# Patient Record
Sex: Female | Born: 1985 | Race: White | Hispanic: No | Marital: Married | Smoking: Never smoker
Health system: Southern US, Community
[De-identification: ages and names within clinical notes are randomized; demographics above are authoritative.]

## PROBLEM LIST (undated history)

## (undated) DIAGNOSIS — D649 Anemia, unspecified: Secondary | ICD-10-CM

## (undated) DIAGNOSIS — F329 Major depressive disorder, single episode, unspecified: Secondary | ICD-10-CM

## (undated) DIAGNOSIS — S6291XA Unspecified fracture of right wrist and hand, initial encounter for closed fracture: Secondary | ICD-10-CM

## (undated) DIAGNOSIS — M8430XA Stress fracture, unspecified site, initial encounter for fracture: Secondary | ICD-10-CM

## (undated) DIAGNOSIS — B019 Varicella without complication: Secondary | ICD-10-CM

## (undated) DIAGNOSIS — F32A Depression, unspecified: Secondary | ICD-10-CM

## (undated) DIAGNOSIS — T7840XA Allergy, unspecified, initial encounter: Secondary | ICD-10-CM

## (undated) HISTORY — DX: Anemia, unspecified: D64.9

## (undated) HISTORY — DX: Unspecified fracture of right wrist and hand, initial encounter for closed fracture: S62.91XA

## (undated) HISTORY — DX: Major depressive disorder, single episode, unspecified: F32.9

## (undated) HISTORY — DX: Depression, unspecified: F32.A

## (undated) HISTORY — DX: Allergy, unspecified, initial encounter: T78.40XA

## (undated) HISTORY — DX: Varicella without complication: B01.9

## (undated) HISTORY — DX: Stress fracture, unspecified site, initial encounter for fracture: M84.30XA

---
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
## 2010-07-29 DIAGNOSIS — S6291XA Unspecified fracture of right wrist and hand, initial encounter for closed fracture: Secondary | ICD-10-CM

## 2010-07-29 HISTORY — PX: OTHER SURGICAL HISTORY: SHX169

## 2010-07-29 HISTORY — DX: Unspecified fracture of right wrist and hand, initial encounter for closed fracture: S62.91XA

## 2013-03-29 DIAGNOSIS — M8430XA Stress fracture, unspecified site, initial encounter for fracture: Secondary | ICD-10-CM

## 2013-03-29 HISTORY — DX: Stress fracture, unspecified site, initial encounter for fracture: M84.30XA

## 2014-07-29 HISTORY — PX: ESOPHAGOGASTRODUODENOSCOPY: SHX1529

## 2016-10-08 ENCOUNTER — Other Ambulatory Visit (HOSPITAL_COMMUNITY)
Admission: RE | Admit: 2016-10-08 | Discharge: 2016-10-08 | Disposition: A | Discharge status: Home / Self Care | Payer: 59 | Source: Ambulatory Visit | Attending: Nurse Practitioner | Admitting: Nurse Practitioner

## 2016-10-08 ENCOUNTER — Other Ambulatory Visit: Payer: Self-pay | Admitting: Nurse Practitioner

## 2016-10-08 DIAGNOSIS — Z01419 Encounter for gynecological examination (general) (routine) without abnormal findings: Secondary | ICD-10-CM | POA: Diagnosis present

## 2016-10-08 DIAGNOSIS — Z1151 Encounter for screening for human papillomavirus (HPV): Secondary | ICD-10-CM | POA: Insufficient documentation

## 2016-10-08 DIAGNOSIS — Z113 Encounter for screening for infections with a predominantly sexual mode of transmission: Secondary | ICD-10-CM | POA: Insufficient documentation

## 2016-10-10 LAB — CYTOLOGY - PAP
Chlamydia: NEGATIVE
Diagnosis: NEGATIVE
HPV (WINDOPATH): NOT DETECTED
NEISSERIA GONORRHEA: NEGATIVE

## 2016-10-14 ENCOUNTER — Ambulatory Visit (INDEPENDENT_AMBULATORY_CARE_PROVIDER_SITE_OTHER): Payer: 59 | Admitting: Family Medicine

## 2016-10-14 ENCOUNTER — Telehealth: Payer: Self-pay | Admitting: Family Medicine

## 2016-10-14 ENCOUNTER — Encounter: Payer: Self-pay | Admitting: Family Medicine

## 2016-10-14 VITALS — BP 127/82 | HR 62 | Temp 98.2°F | Resp 20 | Ht 64.5 in | Wt 152.2 lb

## 2016-10-14 DIAGNOSIS — Z7689 Persons encountering health services in other specified circumstances: Secondary | ICD-10-CM | POA: Diagnosis not present

## 2016-10-14 DIAGNOSIS — R42 Dizziness and giddiness: Secondary | ICD-10-CM

## 2016-10-14 DIAGNOSIS — M67472 Ganglion, left ankle and foot: Secondary | ICD-10-CM | POA: Diagnosis not present

## 2016-10-14 LAB — POCT GLYCOSYLATED HEMOGLOBIN (HGB A1C): Hemoglobin A1C: 4.8

## 2016-10-14 NOTE — Telephone Encounter (Signed)
Patient notified and verbalized understanding. 

## 2016-10-14 NOTE — Progress Notes (Signed)
Patient ID: Vicki Young, female  DOB: [birth year], 31 y.o.   MRN: [id number] Patient Care Team    Relationship Specialty Notifications Start End  Ma Hillock, DO PCP - General Family Medicine  10/14/16     Subjective:  Vicki Young is a 31 y.o.  female present for new patient establishment. All past medical history, surgical history, allergies, family history, immunizations, medications and social history were obtained and updated in the electronic medical record today. All recent labs, ED visits and hospitalizations within the last year were reviewed.  Left ankle "lump":  - started 09/21/2016. Noticed because she felt it. She states it was harder, but not softer. She is a runner and sometimes her shoes cause her pain over this area. ~20 miles a week in running. She states she has a wide foot. She also thinks the other foot "might" be get one. She denies injury to area.   Dizziness:   Once a week to once every 2 weeks she will experience "dizziness and shakiness" that is resolved with eating something. She states if she has more than 1 cup of coffee in the morning, and dose not eat "enough" then she will have symptoms. She has cut back on her coffee consumption for this reason. She also has had symptoms when running, and had to stop to get food. She has not checked her sugar at these times, but feels it is always resolved by eating. She states it can happen sometimes even if she just ate 2 hours before. She denies any syncopal episodes, chest pain, shortness of breath, polydipsia.   Health maintenance:  Colonoscopy: No fhx, screen routine.  Mammogram: FHx no. Screen routine.  Cervical cancer screening: last pap: 2008, [facility]? Vicki Cooler, Vicki Young Immunizations: tdap 2007, Influenza 2017 (encouraged yearly) Infectious disease screening: HIV unknown screen.  DEXA: N/A   Depression screen PHQ 2/9 10/14/2016  Decreased Interest 0  Down, Depressed, Hopeless 0  PHQ - 2 Score  0   Current Exercise Habits: Home exercise routine, Type of exercise: Other - see comments (running), Time (Minutes): 45, Frequency (Times/Week): 6, Weekly Exercise (Minutes/Week): 270, Intensity: Moderate    Immunization History  Administered Date(s) Administered  . Influenza-Unspecified 04/28/2016  . Tdap 07/29/2005     Past Medical History:  Diagnosis Date  . Allergy   . Anemia   . Chicken pox   . Depression   . Right hand fracture 2012   Allergies  Allergen Reactions  . Other Anaphylaxis    Vicki Young, Vicki Young   Past Surgical History:  Procedure Laterality Date  . endometrial cyst  2003  . ESOPHAGOGASTRODUODENOSCOPY  2016   "removal of meat stuck in her throat" with EGD  . right hand fracture  2012  . TONSILLECTOMY AND ADENOIDECTOMY  2003  . WISDOM TOOTH EXTRACTION  2004   Family History  Problem Relation Age of Onset  . Ovarian cancer Mother   . Depression Mother   . Alcohol abuse Father   . Lung cancer Father   . Stomach cancer Father   . Depression Sister   . Polycystic ovary syndrome Sister   . Endometriosis Sister   . Kidney cancer Maternal Uncle   . Muscular dystrophy Maternal Uncle   . Kidney cancer Maternal Grandmother   . Multiple myeloma Maternal Grandfather   . Stroke Maternal Grandfather    Social History   Social History  . Marital status: Single    Spouse name: N/A  .  Number of children: 0  . Years of education: 63   Occupational History  . sales    Social History Main Topics  . Smoking status: Never Smoker  . Smokeless tobacco: Never Used  . Alcohol use 3.0 oz/week    5 Cans of beer per week  . Drug use: No  . Sexual activity: Yes    Partners: Male    Birth control/ protection: IUD   Other Topics Concern  . Not on file   Social History Narrative   Single. BS degree. In sales.   Drinks caffeine.    Wears seatbelt, Smoke detector in the home.    Firearms in the home.    Exercises routinely.    Feels safe in relationships.        Allergies as of 10/14/2016      Reactions   Other Anaphylaxis   Vicki Young, Vicki Young      Medication List       Accurate as of 10/14/16 12:15 PM. Always use your most recent med list.          fluticasone 50 MCG/ACT nasal spray Commonly known as:  FLONASE Place 2 sprays into both nostrils daily.        Recent Results (from the past 2160 hour(s))  Cytology - PAP     Status: None   Collection Time: 10/08/16 12:00 AM  Result Value Ref Range   Adequacy      Satisfactory for evaluation  endocervical/transformation zone component PRESENT.   Diagnosis      NEGATIVE FOR INTRAEPITHELIAL LESIONS OR MALIGNANCY.   Diagnosis      A LETTER WAS SENT TO THE PATIENT INFORMING HER OF THE ABOVE RESULTS.   HPV NOT DETECTED     Comment: Normal Reference Range - NOT Detected   Chlamydia Negative     Comment: Normal Reference Range - Negative   Neisseria gonorrhea Negative     Comment: Normal Reference Range - Negative   Material Submitted CervicoVaginal Pap [ThinPrep Imaged]    CYTOLOGY - PAP PAP RESULT   POCT glycosylated hemoglobin (Hb A1C)     Status: None   Collection Time: 10/14/16 11:00 AM  Result Value Ref Range   Hemoglobin A1C 4.8     No results found.   ROS: 14 pt review of systems performed and negative (unless mentioned in an HPI)  Objective: BP 127/82 (BP Location: Right Arm, Patient Position: Sitting, Cuff Size: Normal)   Pulse 62   Temp 98.2 F (36.8 C)   Resp 20   Ht 5' 4.5" (1.638 m)   Wt 152 lb 4 oz (69.1 kg)   LMP 10/10/2016   SpO2 100%   BMI 25.73 kg/m  Gen: Afebrile. No acute distress. Nontoxic in appearance, well-developed, well-nourished,  Caucasian female.  HENT: AT. [place].MMM Eyes:Pupils Equal Round Reactive to light, Extraocular movements intact,  Conjunctiva without redness, discharge or icterus. Neck/lymp/endocrine: Supple,no lymphadenopathy, no thyromegaly CV: RRR  Chest: CTAB, no wheeze, rhonchi or crackles.   Abd: Soft. NTND. BS present.    Skin:Warm and well-perfused. Skin intact. Neuro/Msk: Normal gait.Alert. Oriented x3. ~ 2 cm mobile round mass left navicular area. No TTP. No erythema. NV intact distally.  Psych: Normal affect, dress and demeanor. Normal speech. Normal thought content and judgment. Results for orders placed or performed in visit on 10/14/16 (from the past 24 hour(s))  POCT glycosylated hemoglobin (Hb A1C)     Status: None   Collection Time: 10/14/16 11:00 AM  Result Value  Ref Range   Hemoglobin A1C 4.8      Assessment/plan: Vicki Young is a 31 y.o. female present for establishment of care with acute complaint.  Dizziness - Discussed could be episodes of hypoglycemia, but difficult to be certain. Will r/o diabetes.  - Pt to increase the protein content in her diet, especially in the morning before runs.  - If after making dietary changes she continues to have symptoms, would want to refer to endocrine for further eval. Pt aware.  - POCT glycosylated hemoglobin (Hb A1C): 4.8  Ganglion cyst of left foot - this is most consistent with ganglion cyst. Discussed options with her today. I would recommend Sports med referral first, they can [place] in the office and possible drain if it appears to be small cyst. Could also consider ortho if she desired.   Return in about 3 months (around 01/14/2017) for CPE.  Electronically signed by: Howard Pouch, DO [place]

## 2016-10-14 NOTE — Patient Instructions (Signed)
Higher protein content meals. I will call you call you with lab result once I get results.   With foot, make certain shoes are not too old. If area doe snot go away, we would send you to Sports medicine and they can US and give recommendations.   It was a pleasure to meet you.    Please help us help you:  We are honored you have chosen [facility] your [facility] home. Below you will find basic instructions that you may need to access in the future. Please help us help you by reading the instructions, which cover many of the frequent questions we experience.   Prescription refills and request:  -In order to allow more efficient response time, please call your pharmacy for all refills. They will forward the request electronically to us. This allows for the quickest possible response. Request left on a nurse line can take longer to refill, since these are checked as time allows between office patients and other phone calls.  - refill request can take up to 3-5 working days to complete.  - If request is sent electronically and request is appropiate, it is usually completed in 1-2 business days.  - all patients will need to be seen routinely for all chronic medical conditions requiring prescription medications (see follow-up below). If you are overdue for follow up on your condition, you will be asked to make an appointment and we will call in enough medication to cover you until your appointment (up to 30 days).  - all controlled substances will require a face to face visit to request/refill.  - if you desire your prescriptions to go through a new pharmacy, and have an active script at original pharmacy, you will need to call your pharmacy and have scripts transferred to new pharmacy. This is completed between the pharmacy locations and not by your provider.    Results: If any images or labs were ordered, it can take up to 1 week to get results depending on the test ordered and the  lab/facility running and resulting the test. - Normal or stable results, which do not need further discussion, will be released to your mychart immediately with attached note to you. A call will not be generated for normal results. Please make certain to sign up for mychart. If you have questions on how to activate your mychart you can call the front office.  - If your results need further discussion, our office will attempt to contact you via phone, and if unable to reach you after 2 attempts, we will release your abnormal result to your mychart with instructions.  - All results will be automatically released in mychart after 1 week.  - Your provider will provide you with explanation and instruction on all relevant material in your results. Please keep in mind, results and labs may appear confusing or abnormal to the untrained eye, but it does not mean they are actually abnormal for you personally. If you have any questions about your results that are not covered, or you desire more detailed explanation than what was provided, you should make an appointment with your provider to do so.   Our office handles many outgoing and incoming calls daily. If we have not contacted you within 1 week about your results, please check your mychart to see if there is a message first and if not, then contact our office.  In helping with this matter, you help decrease call volume, and therefore allow  [place] to be able to respond to patients needs more efficiently.   Acute office visits (sick visit):  An acute visit is intended for a new problem and are scheduled in shorter time slots to allow schedule openings for patients with new problems. This is the appropriate visit to discuss a new problem. In order to provide you with excellent quality medical care with proper time for you to explain your problem, have an exam and receive treatment with instructions, these appointments should be limited to one new problem per visit. If  you experience a new problem, in which you desire to be addressed, please make an acute office visit, we save openings on the schedule to accommodate you. Please do not save your new problem for any other type of visit, let us take care of it properly and quickly for you.   Follow up visits:  Depending on your condition(s) your provider will need to see you routinely in order to provide you with quality care and prescribe medication(s). Most chronic conditions (Example: hypertension, Diabetes, depression/anxiety... etc), require visits a couple times a year. Your provider will instruct you on proper follow up for your personal medical conditions and history. Please make certain to make follow up appointments for your condition as instructed. Failing to do so could result in lapse in your medication treatment/refills. If you request a refill, and are overdue to be seen on a condition, we will always provide you with a 30 day script (once) to allow you time to schedule.    Medicare wellness (well visit): - we have a wonderful Nurse Maudie Mercury), that will meet with you and provide you will yearly medicare wellness visits. These visits should occur yearly (can not be scheduled less than 1 calendar year apart) and cover preventive health, immunizations, advance directives and screenings you are entitled to yearly through your medicare benefits. Do not miss out on your entitled benefits, this is when medicare will pay for these benefits to be ordered for you.  These are strongly encouraged by your provider and is the appropriate type of visit to make certain you are up to date with all preventive health benefits. If you have not had your medicare wellness exam in the last 12 months, please make certain to schedule one by calling the office and schedule your medicare wellness with Maudie Mercury as soon as possible.   Yearly physical (well visit):  - Adults are recommended to be seen yearly for physicals. Check with your  insurance and date of your last physical, most insurances require one calendar year between physicals. Physicals include all preventive health topics, screenings, medical exam and labs that are appropriate for gender/age and history. You may have fasting labs needed at this visit. This is a well visit (not a sick visit), acute topics should not be covered during this visit.  - Pediatric patients are seen more frequently when they are younger. Your provider will advise you on well child visit timing that is appropriate for your their age. - This is not a medicare wellness visit. Medicare wellness exams do not have an exam portion to the visit. Some medicare companies allow for a physical, some do not allow a yearly physical. If your medicare allows a yearly physical you can schedule the medicare wellness with our nurse Maudie Mercury and have your physical with your provider after, on the same day. Please check with insurance for your full benefits.   Late Policy/No Shows:  - all new patients should arrive 15-30  minutes earlier than appointment to allow [place] time  to  obtain all personal demographics,  insurance information and for you to complete office paperwork. - All established patients should arrive 10-15 minutes earlier than appointment time to update all information and be checked in .  - In our best efforts to run on time, if you are late for your appointment you will be asked to either reschedule or if able, we will work you back into the schedule. There will be a wait time to work you back in the schedule,  depending on availability.  - If you are unable to make it to your appointment as scheduled, please call 24 hours ahead of time to allow [place] to fill the time slot with someone else who needs to be seen. If you do not cancel your appointment ahead of time, you may be charged a no show fee.

## 2016-10-14 NOTE — Telephone Encounter (Signed)
Please call pt: - her a1c/diabetes screen was normal at 4.8.

## 2016-10-16 ENCOUNTER — Encounter: Payer: Self-pay | Admitting: Family Medicine

## 2017-04-10 ENCOUNTER — Ambulatory Visit (INDEPENDENT_AMBULATORY_CARE_PROVIDER_SITE_OTHER): Payer: 59 | Admitting: Family Medicine

## 2017-04-10 ENCOUNTER — Encounter: Payer: Self-pay | Admitting: Family Medicine

## 2017-04-10 VITALS — BP 135/82 | HR 64 | Temp 98.1°F | Resp 20 | Ht 64.0 in | Wt 148.5 lb

## 2017-04-10 DIAGNOSIS — R42 Dizziness and giddiness: Secondary | ICD-10-CM

## 2017-04-10 DIAGNOSIS — Z0000 Encounter for general adult medical examination without abnormal findings: Secondary | ICD-10-CM

## 2017-04-10 DIAGNOSIS — E663 Overweight: Secondary | ICD-10-CM

## 2017-04-10 DIAGNOSIS — Z131 Encounter for screening for diabetes mellitus: Secondary | ICD-10-CM

## 2017-04-10 DIAGNOSIS — Z79899 Other long term (current) drug therapy: Secondary | ICD-10-CM

## 2017-04-10 DIAGNOSIS — Z13220 Encounter for screening for lipoid disorders: Secondary | ICD-10-CM | POA: Diagnosis not present

## 2017-04-10 DIAGNOSIS — Z130 Encounter for screening for diseases of the blood and blood-forming organs and certain disorders involving the immune mechanism: Secondary | ICD-10-CM

## 2017-04-10 LAB — CBC WITH DIFFERENTIAL/PLATELET
BASOS ABS: 0.1 10*3/uL (ref 0.0–0.1)
Basophils Relative: 1.2 % (ref 0.0–3.0)
Eosinophils Absolute: 0.7 10*3/uL (ref 0.0–0.7)
Eosinophils Relative: 9.7 % — ABNORMAL HIGH (ref 0.0–5.0)
HEMATOCRIT: 40.9 % (ref 36.0–46.0)
Hemoglobin: 13.5 g/dL (ref 12.0–15.0)
LYMPHS PCT: 38.5 % (ref 12.0–46.0)
Lymphs Abs: 2.8 10*3/uL (ref 0.7–4.0)
MCHC: 33.1 g/dL (ref 30.0–36.0)
MCV: 91.5 fl (ref 78.0–100.0)
MONOS PCT: 7.1 % (ref 3.0–12.0)
Monocytes Absolute: 0.5 10*3/uL (ref 0.1–1.0)
NEUTROS PCT: 43.5 % (ref 43.0–77.0)
Neutro Abs: 3.1 10*3/uL (ref 1.4–7.7)
Platelets: 359 10*3/uL (ref 150.0–400.0)
RBC: 4.47 Mil/uL (ref 3.87–5.11)
RDW: 13.8 % (ref 11.5–15.5)
WBC: 7.2 10*3/uL (ref 4.0–10.5)

## 2017-04-10 LAB — POCT URINALYSIS DIPSTICK
BILIRUBIN UA: NEGATIVE
GLUCOSE UA: NEGATIVE
Ketones, UA: NEGATIVE
LEUKOCYTES UA: NEGATIVE
NITRITE UA: NEGATIVE
Protein, UA: NEGATIVE
Spec Grav, UA: 1.01 (ref 1.010–1.025)
Urobilinogen, UA: 0.2 E.U./dL
pH, UA: 7 (ref 5.0–8.0)

## 2017-04-10 LAB — COMPREHENSIVE METABOLIC PANEL
ALT: 12 U/L (ref 0–35)
AST: 16 U/L (ref 0–37)
Albumin: 4.6 g/dL (ref 3.5–5.2)
Alkaline Phosphatase: 59 U/L (ref 39–117)
BILIRUBIN TOTAL: 0.5 mg/dL (ref 0.2–1.2)
BUN: 12 mg/dL (ref 6–23)
CO2: 28 meq/L (ref 19–32)
Calcium: 10 mg/dL (ref 8.4–10.5)
Chloride: 104 mEq/L (ref 96–112)
Creatinine, Ser: 0.84 mg/dL (ref 0.40–1.20)
GFR: 83.27 mL/min (ref >60.00)
GLUCOSE: 82 mg/dL (ref 70–99)
POTASSIUM: 4.3 meq/L (ref 3.5–5.1)
Sodium: 138 mEq/L (ref 135–145)
Total Protein: 7.3 g/dL (ref 6.0–8.3)

## 2017-04-10 NOTE — Progress Notes (Deleted)
Patient ID: Vicki Young, female  DOB: 08-Nov-1985, 31 y.o.   MRN: [id number] Patient Care Team    Relationship Specialty Notifications Start End  Ma Hillock, DO PCP - General Family Medicine  10/14/16     Chief Complaint  Patient presents with  . Annual Exam    Subjective:  Vicki Young is a 31 y.o.  Female  present for CPE. All past medical history, surgical history, allergies, family history, immunizations, medications and social history were updated in the electronic medical record today. All recent labs, ED visits and hospitalizations within the last year were reviewed.  Health maintenance: updated 04/10/2017 Colonoscopy: No fhx, screen routine.  Mammogram: FHx no. Screen routine.  Cervical cancer screening: last pap: 2018, [facility]? Dorthy Cooler, NP Immunizations: tdap 2018, Influenza 2018 (encouraged yearly) Infectious disease screening: HIV, pt agreeable today.  DEXA: N/A Assistive device: none Oxygen [facility]:EYEM Patient has a Dental home. Hospitalizations/ED visits: none  Immunization History  Administered Date(s) Administered  . DTaP 01/01/1985, 03/05/1985, 04/22/1985, 05/09/1986, 12/24/1989  . HiB (PRP-OMP) 01/01/1985, 03/05/1985, 04/22/1985, 05/09/1986, 12/05/1986  . IPV 01/01/1985, 03/05/1985, 04/22/1985, 05/09/1986, 12/24/1989  . Influenza-Unspecified 04/28/2016, 04/06/2017  . MMR 02/09/1986, 12/24/1989  . Meningococcal Conjugate 02/23/2003  . Tdap 07/29/2005, 04/06/2017     Depression screen [facility] 2/9 04/10/2017 10/14/2016  Decreased Interest 0 0  Down, Depressed, Hopeless 0 0  PHQ - 2 Score 0 0   No flowsheet data found.   Current Exercise Habits: Home exercise routine;Structured exercise class, Type of exercise: Other - see comments (running), Time (Minutes): 60, Frequency (Times/Week): 5, Weekly Exercise (Minutes/Week): 300, Intensity: Moderate    Immunization History  Administered Date(s) Administered  . DTaP 01/01/1985,  03/05/1985, 04/22/1985, 05/09/1986, 12/24/1989  . HiB (PRP-OMP) 01/01/1985, 03/05/1985, 04/22/1985, 05/09/1986, 12/05/1986  . IPV 01/01/1985, 03/05/1985, 04/22/1985, 05/09/1986, 12/24/1989  . Influenza-Unspecified 04/28/2016, 04/06/2017  . MMR 02/09/1986, 12/24/1989  . Meningococcal Conjugate 02/23/2003  . Tdap 07/29/2005, 04/06/2017    Past Medical History:  Diagnosis Date  . Allergy   . Anemia   . Chicken pox   . Depression   . Right hand fracture 2012  . Stress fracture 03/2013   R patella; had visc and steroid injections (dr. Wynetta Emery- [facility])   Allergies  Allergen Reactions  . Other Anaphylaxis    Dailry, Tomato  . Dairy Aid [Lactase]   . Tylenol [Acetaminophen]    Past Surgical History:  Procedure Laterality Date  . endometrial cyst  2003  . ESOPHAGOGASTRODUODENOSCOPY  2016   "removal of meat stuck in her throat" with EGD  . right hand fracture  2012  . TONSILLECTOMY AND ADENOIDECTOMY  2003  . WISDOM TOOTH EXTRACTION  2004   Family History  Problem Relation Age of Onset  . Ovarian cancer Mother   . Depression Mother   . Alcohol abuse Father   . Lung cancer Father   . Stomach cancer Father   . Seizures Father   . Depression Sister   . Polycystic ovary syndrome Sister   . Endometriosis Sister   . Kidney cancer Maternal Uncle   . Muscular dystrophy Maternal Uncle   . Kidney cancer Maternal Grandmother   . Multiple myeloma Maternal Grandfather   . Stroke Maternal Grandfather    Social History   Social History  . Marital status: Single    Spouse name: N/A  . Number of children: 0  . Years of education: 74   Occupational History  . sales  Social History Main Topics  . Smoking status: Never Smoker  . Smokeless tobacco: Never Used  . Alcohol use 3.0 oz/week    5 Cans of beer per week  . Drug use: No  . Sexual activity: Yes    Partners: Male    Birth control/ protection: IUD   Other Topics Concern  . Not on file   Social History Narrative    Single. BS degree. In sales.   Drinks caffeine.    Wears seatbelt, Smoke detector in the home.    Firearms in the home.    Exercises routinely.    Feels safe in relationships.       Allergies as of 04/10/2017      Reactions   Other Anaphylaxis   Dailry, Tomato   Dairy Aid [lactase]    Tylenol [acetaminophen]       Medication List       Accurate as of 04/10/17 10:03 AM. Always use your most recent med list.          fluticasone 50 MCG/ACT nasal spray Commonly known as:  FLONASE Place 2 sprays into both nostrils daily.            Discharge Care Instructions        Start     Ordered   04/10/17 0000  CBC w/Diff     04/10/17 0952   04/10/17 0000  Comp Met (CMET)     04/10/17 0952   04/10/17 0000  POCT Urinalysis Dipstick     04/10/17 2841      All past medical history, surgical history, allergies, family history, immunizations andmedications were updated in the EMR today and reviewed under the history and medication portions of their EMR.     No results found for this or any previous visit (from the past 2160 hour(s)).  No results found.   ROS: 14 pt review of systems performed and negative (unless mentioned in an HPI)  Objective: BP 135/82 (BP Location: Right Arm, Patient Position: Sitting, Cuff Size: Normal)   Pulse 64   Temp 98.1 F (36.7 C)   Resp 20   Ht '5\' 4"'$  (1.626 m)   Wt 148 lb 8 oz (67.4 kg)   SpO2 100%   BMI 25.49 kg/m  Gen: Afebrile. No acute distress. Nontoxic in appearance, well-developed, well-nourished,  Very pleasant caucasian female.  HENT: AT. [place]. Bilateral TM visualized and normal in appearance, normal external auditory canal. MMM, no oral lesions, adequate dentition. Bilateral nares within normal limits. Throat without erythema, ulcerations or exudates. no Cough on exam, no hoarseness on exam. Eyes:Pupils Equal Round Reactive to light, Extraocular movements intact,  Conjunctiva without redness, discharge or  icterus. Neck/lymp/endocrine: Supple,no lymphadenopathy, no thyromegaly CV: RRR no murmur, no edema, +2/4 P posterior tibialis pulses.  Chest: CTAB, no wheeze, rhonchi or crackles. Normal  Respiratory effort. good Air movement. Abd: Soft. flat. NTND. BS present. no Masses palpated. No hepatosplenomegaly. No rebound tenderness or guarding. Skin: no rashes, purpura or petechiae. Warm and well-perfused. Skin intact. Neuro/Msk: Normal gait. PERLA. EOMi. Alert. Oriented x3. Cranial nerves II through XII intact. Muscle strength 5/5 upper/lower extremity. DTRs equal bilaterally. Psych: Normal affect, dress and demeanor. Normal speech. Normal thought content and judgment.   Visual Acuity Screening   Right eye Left eye Both eyes  Without correction: '20/20 20/25 20/20 '$  With correction:       Assessment/plan: REANNE NELLUMS is a 31 y.o. female present for CPE. Encounter for preventive health examination  Patient was encouraged to exercise greater than 150 minutes a week. Patient was encouraged to choose a diet filled with fresh fruits and vegetables, and lean meats. AVS provided to patient today for education/recommendation on gender specific health and safety maintenance. Colonoscopy: No fhx, screen routine.  Mammogram: FHx no. Screen routine.  Cervical cancer screening: last pap: 2018, [facility]? Dorthy Cooler, NP Immunizations: tdap 2018, Influenza 2018 (encouraged yearly) Infectious disease screening: HIV, pt agreeable today.  DEXA: N/A Screening for iron deficiency anemia - CBC w/Diff Dizziness - CMP - POCT Urinalysis Dipstick  Patient has formed a fill out for her college and PT program. She is also getting hepatitis B series through [facility], and able for this once completed.  Return in about 1 year (around 04/10/2018) for CPE.  Electronically signed by: Howard Pouch, DO [place]

## 2017-04-10 NOTE — Patient Instructions (Signed)

## 2018-07-08 ENCOUNTER — Encounter: Payer: 59 | Admitting: Family Medicine

## 2018-07-13 ENCOUNTER — Encounter: Payer: Self-pay | Admitting: Family Medicine

## 2018-07-13 ENCOUNTER — Ambulatory Visit (INDEPENDENT_AMBULATORY_CARE_PROVIDER_SITE_OTHER): Payer: 59 | Admitting: Family Medicine

## 2018-07-13 VITALS — BP 134/89 | HR 96 | Temp 97.6°F | Resp 16 | Ht 63.75 in | Wt 157.0 lb

## 2018-07-13 DIAGNOSIS — Z114 Encounter for screening for human immunodeficiency virus [HIV]: Secondary | ICD-10-CM

## 2018-07-13 DIAGNOSIS — Z130 Encounter for screening for diseases of the blood and blood-forming organs and certain disorders involving the immune mechanism: Secondary | ICD-10-CM

## 2018-07-13 DIAGNOSIS — E663 Overweight: Secondary | ICD-10-CM | POA: Diagnosis not present

## 2018-07-13 DIAGNOSIS — Z131 Encounter for screening for diabetes mellitus: Secondary | ICD-10-CM

## 2018-07-13 DIAGNOSIS — Z0000 Encounter for general adult medical examination without abnormal findings: Secondary | ICD-10-CM | POA: Diagnosis not present

## 2018-07-13 DIAGNOSIS — N926 Irregular menstruation, unspecified: Secondary | ICD-10-CM

## 2018-07-13 DIAGNOSIS — Z79899 Other long term (current) drug therapy: Secondary | ICD-10-CM

## 2018-07-13 DIAGNOSIS — Z975 Presence of (intrauterine) contraceptive device: Secondary | ICD-10-CM

## 2018-07-13 DIAGNOSIS — J301 Allergic rhinitis due to pollen: Secondary | ICD-10-CM

## 2018-07-13 LAB — CBC WITH DIFFERENTIAL/PLATELET
BASOS ABS: 0 10*3/uL (ref 0.0–0.1)
Basophils Relative: 0.4 % (ref 0.0–3.0)
EOS PCT: 4.2 % (ref 0.0–5.0)
Eosinophils Absolute: 0.4 10*3/uL (ref 0.0–0.7)
HCT: 41.8 % (ref 36.0–46.0)
HEMOGLOBIN: 14.3 g/dL (ref 12.0–15.0)
LYMPHS ABS: 2.3 10*3/uL (ref 0.7–4.0)
LYMPHS PCT: 22.5 % (ref 12.0–46.0)
MCHC: 34.1 g/dL (ref 30.0–36.0)
MCV: 90.1 fl (ref 78.0–100.0)
MONOS PCT: 6.6 % (ref 3.0–12.0)
Monocytes Absolute: 0.7 10*3/uL (ref 0.1–1.0)
NEUTROS PCT: 66.3 % (ref 43.0–77.0)
Neutro Abs: 6.7 10*3/uL (ref 1.4–7.7)
Platelets: 354 10*3/uL (ref 150.0–400.0)
RBC: 4.64 Mil/uL (ref 3.87–5.11)
RDW: 12.4 % (ref 11.5–15.5)
WBC: 10.1 10*3/uL (ref 4.0–10.5)

## 2018-07-13 LAB — COMPREHENSIVE METABOLIC PANEL
ALBUMIN: 4.9 g/dL (ref 3.5–5.2)
ALT: 12 U/L (ref 0–35)
AST: 15 U/L (ref 0–37)
Alkaline Phosphatase: 49 U/L (ref 39–117)
BUN: 10 mg/dL (ref 6–23)
CO2: 27 mEq/L (ref 19–32)
Calcium: 10.2 mg/dL (ref 8.4–10.5)
Chloride: 102 mEq/L (ref 96–112)
Creatinine, Ser: 0.89 mg/dL (ref 0.40–1.20)
GFR: 77.3 mL/min (ref >60.00)
Glucose, Bld: 96 mg/dL (ref 70–99)
Potassium: 4.5 mEq/L (ref 3.5–5.1)
Sodium: 136 mEq/L (ref 135–145)
Total Bilirubin: 0.6 mg/dL (ref 0.2–1.2)
Total Protein: 7.4 g/dL (ref 6.0–8.3)

## 2018-07-13 LAB — LIPID PANEL
CHOLESTEROL: 160 mg/dL (ref 0–200)
HDL: 62.2 mg/dL (ref >39.00)
LDL Cholesterol: 83 mg/dL (ref 0–99)
NonHDL: 97.48
TRIGLYCERIDES: 72 mg/dL (ref 0.0–149.0)
Total CHOL/HDL Ratio: 3
VLDL: 14.4 mg/dL (ref 0.0–40.0)

## 2018-07-13 LAB — TSH: TSH: 0.62 u[IU]/mL (ref 0.35–4.50)

## 2018-07-13 LAB — HEMOGLOBIN A1C: Hgb A1c MFr Bld: 4.8 % (ref 4.6–6.5)

## 2018-07-13 MED ORDER — LEVOCETIRIZINE DIHYDROCHLORIDE 5 MG PO TABS: 5.0000 mg | ORAL_TABLET | Freq: Every evening | ORAL | 3 refills | Status: DC

## 2018-07-13 NOTE — Progress Notes (Signed)
Patient ID: Vicki Young, female  DOB: [birth year], 32 y.o.   MRN: [id number] Patient Care Team    Relationship Specialty Notifications Start End  Ma Hillock, DO PCP - General Family Medicine  10/14/16   Arlyce Harman, NP Nurse Practitioner Nurse Practitioner  07/13/18     Chief Complaint  Patient presents with  . Annual Exam    Pt is not fasting. Has some blood sugar concerns, feels like it is to low, gets shaky and blurry vision that will go dark.    Subjective:  Vicki Young is a 32 y.o.  Female  present for CPE . All past medical history, surgical history, allergies, family history, immunizations, medications and social history were updated in the electronic medical record today. All recent labs, ED visits and hospitalizations within the last year were reviewed.  Health maintenance: updated 07/13/18 Colonoscopy: No fhx, screen routine.  Mammogram: FHx no. Screen routine.  Cervical cancer screening: last pap: 2018, [name], NP. IUD in place 2018.  Immunizations: tdap 2018, Influenza 2018(encouraged yearly) Infectious disease screening: HIV, pt agreeable today.  DEXA: N/A  Depression screen [facility] 2/9 07/13/2018 04/10/2017 10/14/2016  Decreased Interest 0 0 0  Down, Depressed, Hopeless 0 0 0  PHQ - 2 Score 0 0 0   No flowsheet data found.   Immunization History  Administered Date(s) Administered  . DTaP 01/01/1985, 03/05/1985, 04/22/1985, 05/09/1986, 12/24/1989  . Hepatitis B 04/11/2017  . HiB (PRP-OMP) 01/01/1985, 03/05/1985, 04/22/1985, 05/09/1986, 12/05/1986  . IPV 01/01/1985, 03/05/1985, 04/22/1985, 05/09/1986, 12/24/1989  . Influenza-Unspecified 04/28/2016, 04/06/2017  . MMR 02/09/1986, 12/24/1989  . Meningococcal Conjugate 02/23/2003  . Tdap 07/29/2005, 04/06/2017     Past Medical History:  Diagnosis Date  . Allergy   . Anemia   . Chicken pox   . Depression   . Right hand fracture 2012  . Stress fracture 03/2013   R  patella; had visc and steroid injections (dr. Wynetta Emery- [facility])   Allergies  Allergen Reactions  . Other Anaphylaxis    Dailry, Tomato  . Dairy Aid [Lactase]   . Tylenol [Acetaminophen]    Past Surgical History:  Procedure Laterality Date  . endometrial cyst  2003  . ESOPHAGOGASTRODUODENOSCOPY  2016   "removal of meat stuck in her throat" with EGD  . right hand fracture  2012  . TONSILLECTOMY AND ADENOIDECTOMY  2003  . WISDOM TOOTH EXTRACTION  2004   Family History  Problem Relation Age of Onset  . Ovarian cancer Mother   . Depression Mother   . Alcohol abuse Father   . Lung cancer Father   . Stomach cancer Father   . Seizures Father   . Depression Sister   . Polycystic ovary syndrome Sister   . Endometriosis Sister   . Kidney cancer Maternal Uncle   . Muscular dystrophy Maternal Uncle   . Kidney cancer Maternal Grandmother   . Multiple myeloma Maternal Grandfather   . Stroke Maternal Grandfather    Social History   Socioeconomic History  . Marital status: Single    Spouse name: Not on file  . Number of children: 0  . Years of education: 52  . Highest education level: Not on file  Occupational History  . Occupation: Geographical information systems officer  . Financial resource strain: Not on file  . Food insecurity:    Worry: Not on file    Inability: Not on file  . Transportation needs:    Medical: Not  on file    Non-medical: Not on file  Tobacco Use  . Smoking status: Never Smoker  . Smokeless tobacco: Never Used  Substance and Sexual Activity  . Alcohol use: Yes    Alcohol/week: 5.0 standard drinks    Types: 5 Cans of beer per week  . Drug use: No  . Sexual activity: Yes    Partners: Male    Birth control/protection: I.U.D.  Lifestyle  . Physical activity:    Days per week: Not on file    Minutes per session: Not on file  . Stress: Not on file  Relationships  . Social connections:    Talks on phone: Not on file    Gets together: Not on file    Attends  religious service: Not on file    Active member of club or organization: Not on file    Attends meetings of clubs or organizations: Not on file    Relationship status: Not on file  . Intimate partner violence:    Fear of current or ex partner: Not on file    Emotionally abused: Not on file    Physically abused: Not on file    Forced sexual activity: Not on file  Other Topics Concern  . Not on file  Social History Narrative   Single. BS degree. In sales.   Drinks caffeine.    Wears seatbelt, Smoke detector in the home.    Firearms in the home.    Exercises routinely.    Feels safe in relationships.    Allergies as of 07/13/2018      Reactions   Other Anaphylaxis   Dailry, Tomato   Dairy Aid [lactase]    Tylenol [acetaminophen]       Medication List       Accurate as of July 13, 2018  1:30 PM. Always use your most recent med list.        levocetirizine 5 MG tablet Commonly known as:  XYZAL Take 1 tablet (5 mg total) by mouth every evening.       All past medical history, surgical history, allergies, family history, immunizations andmedications were updated in the EMR today and reviewed under the history and medication portions of their EMR.     No results found for this or any previous visit (from the past 2160 hour(s)).  No results found.   ROS: 14 pt review of systems performed and negative (unless mentioned in an HPI)  Objective: BP 134/89 (BP Location: Left Arm, Patient Position: Sitting, Cuff Size: Normal)   Pulse 96   Temp 97.6 F (36.4 C) (Oral)   Resp 16   Ht 5' 3.75" (1.619 m)   Wt 157 lb (71.2 kg)   SpO2 100%   BMI 27.16 kg/m  Gen: Afebrile. No acute distress. Nontoxic in appearance, well-developed, well-nourished,  Mildly Overweight female.  HENT: AT. . Bilateral TM visualized and normal in appearance- with some bilateral fullness, normal external auditory canal. MMM, no oral lesions, adequate dentition. Bilateral nares within normal  limits- mild swelling. Throat without erythema, ulcerations or exudates. no Cough on exam, no hoarseness on exam. Eyes:Pupils Equal Round Reactive to light, Extraocular movements intact,  Conjunctiva without redness, discharge or icterus. Neck/lymp/endocrine: Supple,no lymphadenopathy, no thyromegaly CV: RRR no murmur, no edema, +2/4 P posterior tibialis pulses. no carotid bruits. No JVD. Chest: CTAB, no wheeze, rhonchi or crackles. normal Respiratory effort. good Air movement. Abd: Soft. flat. NTND. BS present. no Masses palpated. No hepatosplenomegaly. No rebound tenderness  or guarding. Skin: no rashes, purpura or petechiae. Warm and well-perfused. Skin intact. Neuro/Msk:  Normal gait. PERLA. EOMi. Alert. Oriented x3.  Cranial nerves II through XII intact. Muscle strength 5/5 upper/lower extremity. DTRs equal bilaterally. Psych: Normal affect, dress and demeanor. Normal speech. Normal thought content and judgment.    Hearing Screening   '125Hz'$  '250Hz'$  '500Hz'$  '1000Hz'$  '2000Hz'$  '3000Hz'$  '4000Hz'$  '6000Hz'$  '8000Hz'$   Right ear:   Pass Pass Pass Pass Pass    Left ear:   Pass Pass Pass Pass Pass      Visual Acuity Screening   Right eye Left eye Both eyes  Without correction: '20/20 20/30 20/20 '$  With correction:       Assessment/plan: Vicki Young is a 32 y.o. female present for CPE. Overweight (BMI 25.0-29.9) - Lipid Profile Encounter for screening for HIV - HIV antibody (with reflex) Screening for deficiency anemia - CBC w/Diff Encounter for long-term current use of medication/IUD/IUD (intrauterine device) in place - Comp Met (CMET) Screening for diabetes mellitus - HgB A1c Seasonal allergic rhinitis due to pollen - try switching zyrtec to xyzal. Restart flonase. Use daily nasal saline. If no improvement would consider singulair addition.  - levocetirizine (XYZAL) 5 MG tablet; Take 1 tablet (5 mg total) by mouth every evening.  Dispense: 90 tablet; Refill: 3 Irregular menses - TSH Encounter  for preventive health examination - Visual acuity screening/hearing screen Patient was encouraged to exercise greater than 150 minutes a week. Patient was encouraged to choose a diet filled with fresh fruits and vegetables, and lean meats. AVS provided to patient today for education/recommendation on gender specific health and safety maintenance. Colonoscopy: No fhx, screen routine.  Mammogram: FHx no. Screen routine.  Cervical cancer screening: last pap: 2018, [name], NP. IUD in place 2018. She is establishing with [facility] this year  Immunizations: tdap 2018, Influenza 2018(encouraged yearly) Infectious disease screening: HIV, pt agreeable today.  DEXA: N/A Return in about 1 year (around 07/14/2019) for CPE.  Electronically signed by: Howard Pouch, DO [place]

## 2018-07-13 NOTE — Progress Notes (Signed)
Patient ID: Vicki Young, female  DOB: [birth year], 32 y.o.   MRN: [id number] Patient Care Team    Relationship Specialty Notifications Start End  Ma Hillock, DO PCP - General Family Medicine  10/14/16     Chief Complaint  Patient presents with  . Annual Exam    Subjective:  Vicki Young is a 32 y.o.  Female  present for CPE. All past medical history, surgical history, allergies, family history, immunizations, medications and social history were updatde in the electronic medical record today. All recent labs, ED visits and hospitalizations within the last year were reviewed.  Health maintenance: updated 07/13/18 Colonoscopy: No fhx, screen routine.  Mammogram: FHx no. Screen routine.  Cervical cancer screening: last pap: 2018, [name], NP Immunizations: tdap 2018, Influenza 2018(encouraged yearly) Infectious disease screening: HIV, pt agreeable today.  DEXA: N/A Assistive device: none Oxygen KYH:CWCB Patient has a Dental home. Hospitalizations/ED visits: reviewed  Depression screen [facility] 2/9 04/10/2017 10/14/2016  Decreased Interest 0 0  Down, Depressed, Hopeless 0 0  PHQ - 2 Score 0 0   No flowsheet data found.   Current Exercise Habits: Home exercise routine;Structured exercise class, Type of exercise: Other - see comments(running), Time (Minutes): 60, Frequency (Times/Week): 5, Weekly Exercise (Minutes/Week): 300, Intensity: Moderate    Immunization History  Administered Date(s) Administered  . DTaP 01/01/1985, 03/05/1985, 04/22/1985, 05/09/1986, 12/24/1989  . Hepatitis B 04/11/2017  . HiB (PRP-OMP) 01/01/1985, 03/05/1985, 04/22/1985, 05/09/1986, 12/05/1986  . IPV 01/01/1985, 03/05/1985, 04/22/1985, 05/09/1986, 12/24/1989  . Influenza-Unspecified 04/28/2016, 04/06/2017  . MMR 02/09/1986, 12/24/1989  . Meningococcal Conjugate 02/23/2003  . Tdap 07/29/2005, 04/06/2017     Past Medical History:  Diagnosis Date  . Allergy   .  Anemia   . Chicken pox   . Depression   . Right hand fracture 2012  . Stress fracture 03/2013   R patella; had visc and steroid injections (dr. Wynetta Emery- [facility])   Allergies  Allergen Reactions  . Other Anaphylaxis    Dailry, Tomato  . Dairy Aid [Lactase]   . Tylenol [Acetaminophen]    Past Surgical History:  Procedure Laterality Date  . endometrial cyst  2003  . ESOPHAGOGASTRODUODENOSCOPY  2016   "removal of meat stuck in her throat" with EGD  . right hand fracture  2012  . TONSILLECTOMY AND ADENOIDECTOMY  2003  . WISDOM TOOTH EXTRACTION  2004   Family History  Problem Relation Age of Onset  . Ovarian cancer Mother   . Depression Mother   . Alcohol abuse Father   . Lung cancer Father   . Stomach cancer Father   . Seizures Father   . Depression Sister   . Polycystic ovary syndrome Sister   . Endometriosis Sister   . Kidney cancer Maternal Uncle   . Muscular dystrophy Maternal Uncle   . Kidney cancer Maternal Grandmother   . Multiple myeloma Maternal Grandfather   . Stroke Maternal Grandfather    Social History   Socioeconomic History  . Marital status: Single    Spouse name: Not on file  . Number of children: 0  . Years of education: 44  . Highest education level: Not on file  Occupational History  . Occupation: Geographical information systems officer  . Financial resource strain: Not on file  . Food insecurity:    Worry: Not on file    Inability: Not on file  . Transportation needs:    Medical: Not on file    Non-medical:  Not on file  Tobacco Use  . Smoking status: Never Smoker  . Smokeless tobacco: Never Used  Substance and Sexual Activity  . Alcohol use: Yes    Alcohol/week: 5.0 standard drinks    Types: 5 Cans of beer per week  . Drug use: No  . Sexual activity: Yes    Partners: Male    Birth control/protection: I.U.D.  Lifestyle  . Physical activity:    Days per week: Not on file    Minutes per session: Not on file  . Stress: Not on file  Relationships    . Social connections:    Talks on phone: Not on file    Gets together: Not on file    Attends religious service: Not on file    Active member of club or organization: Not on file    Attends meetings of clubs or organizations: Not on file    Relationship status: Not on file  . Intimate partner violence:    Fear of current or ex partner: Not on file    Emotionally abused: Not on file    Physically abused: Not on file    Forced sexual activity: Not on file  Other Topics Concern  . Not on file  Social History Narrative   Single. BS degree. In sales.   Drinks caffeine.    Wears seatbelt, Smoke detector in the home.    Firearms in the home.    Exercises routinely.    Feels safe in relationships.    Allergies as of 04/10/2017      Reactions   Other Anaphylaxis   Dailry, Tomato   Dairy Aid [lactase]    Tylenol [acetaminophen]       Medication List       Accurate as of April 10, 2017 11:59 PM. Always use your most recent med list.        fluticasone 50 MCG/ACT nasal spray Commonly known as:  FLONASE Place 2 sprays into both nostrils daily.       All past medical history, surgical history, allergies, family history, immunizations andmedications were updated in the EMR today and reviewed under the history and medication portions of their EMR.     No results found for this or any previous visit (from the past 2160 hour(s)).  No results found.   ROS: 14 pt review of systems performed and negative (unless mentioned in an HPI)  Objective: BP 135/82 (BP Location: Right Arm, Patient Position: Sitting, Cuff Size: Normal)   Pulse 64   Temp 98.1 F (36.7 C)   Resp 20   Ht '5\' 4"'$  (1.626 m)   Wt 148 lb 8 oz (67.4 kg)   SpO2 100%   BMI 25.49 kg/m  Error original note Is not deleted   Visual Acuity Screening   Right eye Left eye Both eyes  Without correction: '20/20 20/25 20/20 '$  With correction:        Assessment/plan: Vicki Young is a 32 y.o. female  present for CPE   Patient was encouraged to exercise greater than 150 minutes a week. Patient was encouraged to choose a diet filled with fresh fruits and vegetables, and lean meats. AVS provided to patient today for education/recommendation on gender specific health and safety maintenance. Return in about 1 year (around 04/10/2018) for CPE.  Electronically signed by: Howard Pouch, DO [place]

## 2018-07-13 NOTE — Addendum Note (Signed)
Addended by: Felix PaciniKUNEFF, Tekeyah Santiago A on: 07/13/2018 01:19 PM   Modules accepted: Orders

## 2018-07-13 NOTE — Patient Instructions (Signed)

## 2018-07-14 LAB — HIV ANTIBODY (ROUTINE TESTING W REFLEX): HIV 1&2 Ab, 4th Generation: NONREACTIVE

## 2019-02-05 ENCOUNTER — Encounter: Payer: Self-pay | Admitting: Family Medicine

## 2019-02-05 ENCOUNTER — Other Ambulatory Visit: Payer: Self-pay

## 2019-02-05 ENCOUNTER — Ambulatory Visit (INDEPENDENT_AMBULATORY_CARE_PROVIDER_SITE_OTHER): Payer: 59 | Admitting: Family Medicine

## 2019-02-05 VITALS — BP 129/78 | HR 95 | Temp 98.1°F | Resp 17 | Ht 64.0 in | Wt 154.4 lb

## 2019-02-05 DIAGNOSIS — R2240 Localized swelling, mass and lump, unspecified lower limb: Secondary | ICD-10-CM

## 2019-02-05 DIAGNOSIS — M25551 Pain in right hip: Secondary | ICD-10-CM

## 2019-02-05 DIAGNOSIS — M7989 Other specified soft tissue disorders: Secondary | ICD-10-CM

## 2019-02-05 NOTE — Progress Notes (Signed)
Vicki Young , July 19, 1986, 33 y.o., female MRN: [id number] Patient Care Team    Relationship Specialty Notifications Start End  Ma Hillock, DO PCP - General Family Medicine  10/14/16   Arlyce Harman, NP Nurse Practitioner Nurse Practitioner  07/13/18     Chief Complaint  Patient presents with   Mass    Groin lump. R side x1 yr. Comes and goes, more noticeable with period.    Hip Pain    R side. poping sound. comes and goes. seconadry pop is a different sound from the first pop.      Subjective: Pt presents for an OV with complaints of small mass in her groin area of 1 year duration and right hip pain that has worsened over the last 1 year in duration.    Inguinal mass: Reports she has a small mass in her inguinal/groin area on the right side.  She reports it has been there for about a year.  She states the mass seems to fluctuate in size with her menstrual periods.  Larger during menstruation.  She reports last menstrual cycle the mass became tender and almost felt bruised.  She states it can feel "prickly "or itchy on the inside.  She denies any skin changes or irritation.  She denies any changes in activity prior to onset.  She is a runner.  She reports IUD insertion in 2018.  She states she is able to feel her strings.  She denies any vaginal lesions or vaginal discharge.  She denies any fever, chills or unintentional weight loss.  She reports she has a family history of ovarian cancer in her mother, multiple myeloma and her maternal grandfather and kidney cancer in her maternal uncle.  Her father had lung cancer and stomach cancer.  Hip pain: Patient reports right hip pain that has been present ever since she can recall in her adult life.  She is a runner.  She states that she will "pop "her hip a few times a day and it will loosen up her hip and ease her discomfort.  She states over the last year, about the time she noticed the small mass in her groin, she experienced a  different type of pop in her hip.  She reports the pain is located in her anterior hip joint.  Depression screen [facility] 2/9 07/13/2018 04/10/2017 10/14/2016  Decreased Interest 0 0 0  Down, Depressed, Hopeless 0 0 0  PHQ - 2 Score 0 0 0    Allergies  Allergen Reactions   Other Anaphylaxis    Dailry, Tomato   Dairy Aid [Lactase]    Tylenol [Acetaminophen]    Social History   Social History Narrative   Single. BS degree. In sales.   Drinks caffeine.    Wears seatbelt, Smoke detector in the home.    Firearms in the home.    Exercises routinely.    Feels safe in relationships.    Past Medical History:  Diagnosis Date   Allergy    Anemia    Chicken pox    Depression    Right hand fracture 2012   Stress fracture 03/2013   R patella; had visc and steroid injections (dr. Wynetta Emery- [facility])   Past Surgical History:  Procedure Laterality Date   endometrial cyst  2003   ESOPHAGOGASTRODUODENOSCOPY  2016   "removal of meat stuck in her throat" with EGD   right hand fracture  2012   TONSILLECTOMY AND ADENOIDECTOMY  2003  WISDOM TOOTH EXTRACTION  2004   Family History  Problem Relation Age of Onset   Ovarian cancer Mother    Depression Mother    Alcohol abuse Father    Lung cancer Father    Stomach cancer Father    Seizures Father    Depression Sister    Polycystic ovary syndrome Sister    Endometriosis Sister    Kidney cancer Maternal Uncle    Muscular dystrophy Maternal Uncle    Kidney cancer Maternal Grandmother    Multiple myeloma Maternal Grandfather    Stroke Maternal Grandfather    Allergies as of 02/05/2019      Reactions   Other Anaphylaxis   Dailry, Tomato   Dairy Aid [lactase]    Tylenol [acetaminophen]       Medication List       Accurate as of February 05, 2019  8:35 AM. If you have any questions, ask your nurse or doctor.        fluticasone 50 MCG/ACT nasal spray Commonly known as: FLONASE Place into the nose.     levocetirizine 5 MG tablet Commonly known as: Xyzal Take 1 tablet (5 mg total) by mouth every evening.   levonorgestrel 20 MCG/24HR IUD Commonly known as: MIRENA 1 each by Intrauterine route once.       All past medical history, surgical history, allergies, family history, immunizations andmedications were updated in the EMR today and reviewed under the history and medication portions of their EMR.     ROS: Negative, with the exception of above mentioned in HPI   Objective:  BP 129/78 (BP Location: Right Arm, Patient Position: Sitting, Cuff Size: Normal)    Pulse 95    Temp 98.1 F (36.7 C) (Temporal)    Resp 17    Ht '5\' 4"'$  (1.626 m)    Wt 154 lb 6 oz (70 kg)    LMP 01/24/2019 (Exact Date)    SpO2 99%    BMI 26.50 kg/m  Body mass index is 26.5 kg/m. Gen: Afebrile. No acute distress. Nontoxic in appearance, well developed, well nourished.  HENT: AT. [place]. MMM Eyes:Pupils Equal Round Reactive to light, Extraocular movements intact,  Conjunctiva without redness, discharge or icterus. Abd: Soft.  Flat. NTND. BS present.  No masses palpated. No rebound or guarding.  MSK: Right hip without erythema or skin changes.  No bruising.  Small marble size mobile nontender soft tissue mass inferior to inguinal ligament and medial to sartorius.  Other than palpated distinct mass, no other inguinal lymphadenopathy bilaterally.  Full range of motion of right anterior hip without discomfort.  Negative straight leg raise, negative FABRE, NV intact distally.  Skin: No rashes, purpura or petechiae.  Neuro:  Normal gait. PERLA. EOMi. Alert. Oriented x3  Psych: Normal affect, dress and demeanor. Normal speech. Normal thought content and judgment.  No exam data present No results found. No results found for this or any previous visit (from the past 24 hour(s)).  Assessment/Plan: HILDRETH ORSAK is a 33 y.o. female present for OV for  Mass of soft tissue of thigh/Hip pain, acute, right -Discussed  options with her today.  Suspect this may be a small ganglion cyst or enlarged lymph node.  She has concerns over cancerous origin secondary to her family history.  I am uncertain if the interocular hip pain is associated with a small mass.  Potentially could be if this was an old injury and cyst formation.  There are no reports of infectious signs.  May be inflammatory node.  If area does become increased with her menstrual cycles as he suggests, could be inguinal endometriosis although rare.  There is a family history of endometriosis in her sister. -Discussed options with her today and she is agreeable to start with ultrasound of the area to get a better idea of the characteristic of the mass before moving forward. -If right intraocular hip pain is not associated with the mass, she is not as concerned about moving forward with imaging on her hip joint. -We did discuss recommendations of gluteal strengthening, since she is a runner and this will likely decrease her hip discomfort if associated with her running. - [place] RT LOWER EXTREM LTD SOFT TISSUE NON VASCULAR; Future - f/u dependent on [place] result.     Reviewed expectations re: course of current medical issues.  Discussed self-management of symptoms.  Outlined signs and symptoms indicating need for more acute intervention.  Patient verbalized understanding and all questions were answered.  Patient received an After-Visit Summary.    No orders of the defined types were placed in this encounter.    Note is dictated utilizing voice recognition software. Although note has been proof read prior to signing, occasional typographical errors still can be missed. If any questions arise, please do not hesitate to call for verification.   electronically signed by:  Howard Pouch, DO  [place]

## 2019-02-05 NOTE — Patient Instructions (Signed)
I ordered an [place] of your hip area. They will call you to schedule.  Once we get results - we will discuss further plan.

## 2019-02-17 ENCOUNTER — Encounter: Payer: Self-pay | Admitting: Family Medicine

## 2019-02-17 ENCOUNTER — Telehealth: Payer: Self-pay

## 2019-02-17 NOTE — Telephone Encounter (Signed)
Sorry,but I will have to see what she is talking about in the office before ordering any imaging.-thx

## 2019-02-17 NOTE — Telephone Encounter (Signed)
Copied from [place] [id number]. Topic: General - Other >> Feb 17, 2019  3:21 PM Wynetta Emery, [place] C wrote: Reason for CRM: pt says that she has a order in for Imaging for a Mass. Pt says that she found another Mass near her left kidney. Pt would like to know if order could cover both areas?

## 2019-02-18 NOTE — Telephone Encounter (Signed)
Phone note was created.  Please see phone note.

## 2019-02-18 NOTE — Telephone Encounter (Signed)
Spoke with patient and tried to schedule for in office visit tomorrow with Dr.McGowen. Available slots are blocked. Pt stated she would just proceed with the one scan for now.

## 2019-02-19 ENCOUNTER — Other Ambulatory Visit: Payer: 59

## 2019-02-23 ENCOUNTER — Other Ambulatory Visit: Payer: Self-pay

## 2019-02-23 ENCOUNTER — Ambulatory Visit (INDEPENDENT_AMBULATORY_CARE_PROVIDER_SITE_OTHER): Payer: 59

## 2019-02-23 DIAGNOSIS — M25551 Pain in right hip: Secondary | ICD-10-CM | POA: Diagnosis not present

## 2019-02-23 DIAGNOSIS — M7989 Other specified soft tissue disorders: Secondary | ICD-10-CM

## 2019-02-23 DIAGNOSIS — R2240 Localized swelling, mass and lump, unspecified lower limb: Secondary | ICD-10-CM | POA: Diagnosis not present

## 2019-05-04 ENCOUNTER — Encounter: Payer: Self-pay | Admitting: Family Medicine

## 2019-05-11 ENCOUNTER — Encounter: Payer: Self-pay | Admitting: Family Medicine

## 2019-05-11 ENCOUNTER — Other Ambulatory Visit: Payer: Self-pay

## 2019-05-11 ENCOUNTER — Ambulatory Visit (INDEPENDENT_AMBULATORY_CARE_PROVIDER_SITE_OTHER): Payer: 59 | Admitting: Family Medicine

## 2019-05-11 VITALS — Ht 64.0 in

## 2019-05-11 DIAGNOSIS — Z7189 Other specified counseling: Secondary | ICD-10-CM

## 2019-05-11 DIAGNOSIS — F419 Anxiety disorder, unspecified: Secondary | ICD-10-CM | POA: Diagnosis not present

## 2019-05-11 MED ORDER — HYDROXYZINE PAMOATE 25 MG PO CAPS: 25.0000 mg | ORAL_CAPSULE | Freq: Every day | ORAL | 2 refills | Status: DC

## 2019-05-11 MED ORDER — ESCITALOPRAM OXALATE 10 MG PO TABS: 10.0000 mg | ORAL_TABLET | Freq: Every day | ORAL | 2 refills | Status: DC

## 2019-05-11 NOTE — Progress Notes (Signed)
VIRTUAL VISIT VIA VIDEO  I connected with [name] on 05/11/19 at  2:00 PM EDT by a video enabled telemedicine application and verified that I am speaking with the correct person using two identifiers. Location patient: Home Location provider: [facility], Office Persons participating in the virtual visit: Patient, Dr. Raoul Pitch and R.Baker, LPN  I discussed the limitations of evaluation and management by telemedicine and the availability of in person appointments. The patient expressed understanding and agreed to proceed.   SUBJECTIVE Chief Complaint  Patient presents with  . Anxiety    Pt is wanting to discuss starting anxiety medications     HPI: Vicki Young is a 33 y.o. female presents to discuss new onset anxiety.  Patient reports she has been having increased anxiety since the beginning of the pandemic, but felt she was coping okay with that.  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  She reports this is overwhelming her and giving her much anxiety.  She is worried she is going to bring him the coronavirus to 1 of her family members. Patient reports she was on Wellbutrin greater than 10 years ago for depression.  She states this made her feel more anxious and she does not want to start that medication again.  She states that her sister is on Lexapro. ROS: See pertinent positives and negatives per HPI. Depression screen [facility] 2/9 07/13/2018 04/10/2017 10/14/2016  Decreased Interest 0 0 0  Down, Depressed, Hopeless 0 0 0  PHQ - 2 Score 0 0 0   GAD 7 : Generalized Anxiety Score 05/11/2019  Nervous, Anxious, on Edge 2  Control/stop worrying 2  Worry too much - different things 3  Trouble relaxing 3  Restless 0  Easily annoyed or irritable 1  Afraid - awful might happen 2  Total GAD 7 Score 13  Anxiety Difficulty Not difficult at all    Patient Active Problem List   Diagnosis  Date Noted  . Overweight (BMI 25.0-29.9) 07/13/2018  . IUD (intrauterine device) in place 07/13/2018  . Irregular menses 07/13/2018  . Ganglion cyst of left foot 10/14/2016    Social History   Tobacco Use  . Smoking status: Never Smoker  . Smokeless tobacco: Never Used  Substance Use Topics  . Alcohol use: Yes    Alcohol/week: 5.0 standard drinks    Types: 5 Cans of beer per week    Current Outpatient Medications:  .  levocetirizine (XYZAL) 5 MG tablet, Take 1 tablet (5 mg total) by mouth every evening., Disp: 90 tablet, Rfl: 3 .  levonorgestrel (MIRENA) 20 MCG/24HR IUD, 1 each by Intrauterine route once., Disp: , Rfl:  .  fluticasone (FLONASE) 50 MCG/ACT nasal spray, Place into the nose., Disp: , Rfl:   Allergies  Allergen Reactions  . Other Anaphylaxis    Dailry, Tomato  . Dairy Aid [Lactase]   . Tylenol [Acetaminophen]     OBJECTIVE: Ht 5\' 4"  (1.626 m)   BMI 26.50 kg/m  Gen: No acute distress. Nontoxic in appearance.  HENT: AT. [place].  MMM.  Eyes:Pupils Equal Round Reactive to light, Extraocular movements intact,  Conjunctiva without redness, discharge or icterus. Neuro:  Alert. Oriented x3  Psych: Normal affect, dress and demeanor. Normal speech. Normal thought content and judgment.  ASSESSMENT AND PLAN: Vicki Young is a 33 y.o. female present for  Anxiety New onset anxiety worsening with  pandemic. Discussed different options with her today and patient decided to start Lexapro 10 mg daily and Vistaril 25-50 mg nightly. COVID-19 education was provided to patient today and techniques to avoid transferring coronavirus into her home from her work environment. Follow-up 4-6 weeks  > 25 minutes spent with patient, >50% of time spent face to face    Felix Pacini, DO 05/11/2019

## 2019-05-11 NOTE — Patient Instructions (Signed)

## 2019-08-09 ENCOUNTER — Other Ambulatory Visit: Payer: Self-pay | Admitting: Family Medicine

## 2019-08-11 ENCOUNTER — Other Ambulatory Visit: Payer: Self-pay

## 2019-08-11 DIAGNOSIS — J301 Allergic rhinitis due to pollen: Secondary | ICD-10-CM

## 2019-08-11 MED ORDER — LEVOCETIRIZINE DIHYDROCHLORIDE 5 MG PO TABS: 5.0000 mg | ORAL_TABLET | Freq: Every evening | ORAL | 0 refills | Status: DC

## 2019-08-11 NOTE — Progress Notes (Signed)
Refill request for Xyzal. 30 day supply sent to pharmacy with note stating pt needs to call to make appt for further refills.

## 2019-09-13 ENCOUNTER — Telehealth: Payer: Self-pay | Admitting: Family Medicine

## 2019-09-13 DIAGNOSIS — J301 Allergic rhinitis due to pollen: Secondary | ICD-10-CM

## 2019-09-13 NOTE — Telephone Encounter (Signed)
Pt called in a needs a refill on levocetirizine prescription. She scheduled a physical on 10/07/19 b/c she is getting covid vaccine on weds and wants to wait at least 2 weeks before coming in. She said she will need a prescription before this appointment and would like a call back when it is called in.

## 2019-09-14 MED ORDER — LEVOCETIRIZINE DIHYDROCHLORIDE 5 MG PO TABS: 5.0000 mg | ORAL_TABLET | Freq: Every evening | ORAL | 0 refills | Status: DC

## 2019-09-14 NOTE — Telephone Encounter (Signed)
30 days of medication was sent to the pharmacy. No more refills will be sent to the pharmacy. Pt must make CPE appt.

## 2019-10-07 ENCOUNTER — Other Ambulatory Visit: Payer: Self-pay

## 2019-10-07 ENCOUNTER — Encounter: Payer: 59 | Admitting: Family Medicine

## 2019-10-07 ENCOUNTER — Ambulatory Visit (INDEPENDENT_AMBULATORY_CARE_PROVIDER_SITE_OTHER): Payer: 59 | Admitting: Family Medicine

## 2019-10-07 ENCOUNTER — Encounter: Payer: Self-pay | Admitting: Family Medicine

## 2019-10-07 VITALS — BP 123/87 | HR 89 | Temp 98.0°F | Resp 16 | Ht 64.0 in | Wt 162.0 lb

## 2019-10-07 DIAGNOSIS — Z975 Presence of (intrauterine) contraceptive device: Secondary | ICD-10-CM | POA: Diagnosis not present

## 2019-10-07 DIAGNOSIS — Z0000 Encounter for general adult medical examination without abnormal findings: Secondary | ICD-10-CM

## 2019-10-07 DIAGNOSIS — J301 Allergic rhinitis due to pollen: Secondary | ICD-10-CM

## 2019-10-07 DIAGNOSIS — Z131 Encounter for screening for diabetes mellitus: Secondary | ICD-10-CM

## 2019-10-07 DIAGNOSIS — E663 Overweight: Secondary | ICD-10-CM | POA: Diagnosis not present

## 2019-10-07 DIAGNOSIS — T7840XA Allergy, unspecified, initial encounter: Secondary | ICD-10-CM | POA: Insufficient documentation

## 2019-10-07 DIAGNOSIS — F419 Anxiety disorder, unspecified: Secondary | ICD-10-CM

## 2019-10-07 DIAGNOSIS — Z793 Long term (current) use of hormonal contraceptives: Secondary | ICD-10-CM | POA: Insufficient documentation

## 2019-10-07 DIAGNOSIS — Z130 Encounter for screening for diseases of the blood and blood-forming organs and certain disorders involving the immune mechanism: Secondary | ICD-10-CM

## 2019-10-07 LAB — COMPREHENSIVE METABOLIC PANEL
ALT: 15 U/L (ref 0–35)
AST: 18 U/L (ref 0–37)
Albumin: 4.4 g/dL (ref 3.5–5.2)
Alkaline Phosphatase: 61 U/L (ref 39–117)
BUN: 10 mg/dL (ref 6–23)
CO2: 28 mEq/L (ref 19–32)
Calcium: 9.8 mg/dL (ref 8.4–10.5)
Chloride: 105 mEq/L (ref 96–112)
Creatinine, Ser: 0.87 mg/dL (ref 0.40–1.20)
GFR: 74.12 mL/min (ref >60.00)
Glucose, Bld: 82 mg/dL (ref 70–99)
Potassium: 4.7 mEq/L (ref 3.5–5.1)
Sodium: 138 mEq/L (ref 135–145)
Total Bilirubin: 0.5 mg/dL (ref 0.2–1.2)
Total Protein: 7.1 g/dL (ref 6.0–8.3)

## 2019-10-07 LAB — TSH: TSH: 1.07 u[IU]/mL (ref 0.35–4.50)

## 2019-10-07 LAB — CBC
HCT: 39.6 % (ref 36.0–46.0)
Hemoglobin: 13.6 g/dL (ref 12.0–15.0)
MCHC: 34.3 g/dL (ref 30.0–36.0)
MCV: 90.6 fl (ref 78.0–100.0)
Platelets: 353 10*3/uL (ref 150.0–400.0)
RBC: 4.37 Mil/uL (ref 3.87–5.11)
RDW: 12.2 % (ref 11.5–15.5)
WBC: 6.6 10*3/uL (ref 4.0–10.5)

## 2019-10-07 LAB — LIPID PANEL
Cholesterol: 154 mg/dL (ref 0–200)
HDL: 57.2 mg/dL
LDL Cholesterol: 79 mg/dL (ref 0–99)
NonHDL: 96.61
Total CHOL/HDL Ratio: 3
Triglycerides: 88 mg/dL (ref 0.0–149.0)
VLDL: 17.6 mg/dL (ref 0.0–40.0)

## 2019-10-07 LAB — HEMOGLOBIN A1C: Hgb A1c MFr Bld: 4.7 % (ref 4.6–6.5)

## 2019-10-07 MED ORDER — LEVOCETIRIZINE DIHYDROCHLORIDE 5 MG PO TABS: 5.0000 mg | ORAL_TABLET | Freq: Every evening | ORAL | 3 refills | Status: AC

## 2019-10-07 MED ORDER — ESCITALOPRAM OXALATE 10 MG PO TABS: 10.0000 mg | ORAL_TABLET | Freq: Every day | ORAL | 1 refills | Status: DC

## 2019-10-07 MED ORDER — HYDROXYZINE PAMOATE 25 MG PO CAPS: 25.0000 mg | ORAL_CAPSULE | Freq: Every day | ORAL | 1 refills | Status: DC

## 2019-10-07 NOTE — Progress Notes (Signed)
This visit occurred during the SARS-CoV-2 public health emergency.  Safety protocols were in place, including screening questions prior to the visit, additional usage of staff PPE, and extensive cleaning of exam room while observing appropriate contact time as indicated for disinfecting solutions.    Patient ID: Vicki Young, female  DOB: 07/23/1986, 34 y.o.   MRN: [id number] Patient Care Team    Relationship Specialty Notifications Start End  Ma Hillock, DO PCP - General Family Medicine  10/14/16   Arlyce Harman, NP Nurse Practitioner Nurse Practitioner  07/13/18     Chief Complaint  Patient presents with  . Annual Exam    fasting     Subjective:  Vicki Young is a 34 y.o.  Female  present for CPE. All past medical history, surgical history, allergies, family history, immunizations, medications and social history were updated in the electronic medical record today. All recent labs, ED visits and hospitalizations within the last year were reviewed.  Health maintenance:  Colonoscopy: No fhx, screen routine at 44  Mammogram: FHx no. Screen routine at 39 Cervical cancer screening: last pap: 2018, [name], NP. IUD in place 2018. She is looking to est w/ a new gyn. No LMP recorded. (Menstrual status: IUD). Immunizations: tdap 2018, Influenza 2020(encouraged yearly). covid vaccines completed Infectious disease screening: HIV completed DEXA: N/A Assistive device: none Oxygen IWL:NLGX Patient has a Dental home. Hospitalizations/ED visits: reviewed  Anxiety:  Pt reports she is doing really well on lexapro 10 and vistaril before bed. She would like to stay on the medications.  Prior note: Patient reports she has been having increased anxiety since the beginning of the pandemic, but felt she was coping okay with that.  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  She reports this  is overwhelming her and giving her much anxiety.  She is worried she is going to bring him the coronavirus to 1 of her family members.  Allergy:  Doing well on xyzal. Needs refills. Wants refill eventually to allergy and asthma, but wants to wait for now. She is also concerned about coming off medication for the testing.   Depression screen [facility] 2/9 07/13/2018 04/10/2017 10/14/2016  Decreased Interest 0 0 0  Down, Depressed, Hopeless 0 0 0  PHQ - 2 Score 0 0 0   GAD 7 : Generalized Anxiety Score 05/11/2019  Nervous, Anxious, on Edge 2  Control/stop worrying 2  Worry too much - different things 3  Trouble relaxing 3  Restless 0  Easily annoyed or irritable 1  Afraid - awful might happen 2  Total GAD 7 Score 13  Anxiety Difficulty Not difficult at all    Immunization History  Administered Date(s) Administered  . DTaP 01/01/1985, 03/05/1985, 04/22/1985, 05/09/1986, 12/24/1989  . Hepatitis B 04/11/2017  . Hepatitis B, adult 10/12/2017  . HiB (PRP-OMP) 01/01/1985, 03/05/1985, 04/22/1985, 05/09/1986, 12/05/1986  . IPV 01/01/1985, 03/05/1985, 04/22/1985, 05/09/1986, 12/24/1989  . Influenza Inj Mdck Quad Pf 05/27/2018, 03/23/2019  . Influenza-Unspecified 04/28/2016, 04/06/2017  . MMR 02/09/1986, 12/24/1989  . Meningococcal Conjugate 02/23/2003  . PFIZER SARS-COV-2 Vaccination 08/22/2019, 09/15/2019  . PPD Test 03/25/2016, 07/23/2017, 08/05/2018, 04/30/2019  . Tdap 07/29/2005, 04/06/2017    Past Medical History:  Diagnosis Date  . Allergy   . Anemia   . Chicken pox   . Depression   . Right hand fracture 2012  . Stress fracture 03/2013  R patella; had visc and steroid injections (dr. Wynetta Emery- [facility])   Allergies  Allergen Reactions  . Other Anaphylaxis    Dailry, Tomato  . Dairy Aid [Lactase]   . Tylenol [Acetaminophen]    Past Surgical History:  Procedure Laterality Date  . endometrial cyst  2003  . ESOPHAGOGASTRODUODENOSCOPY  2016   "removal of meat stuck in her  throat" with EGD  . right hand fracture  2012  . TONSILLECTOMY AND ADENOIDECTOMY  2003  . WISDOM TOOTH EXTRACTION  2004   Family History  Problem Relation Age of Onset  . Ovarian cancer Mother   . Depression Mother   . Alcohol abuse Father   . Lung cancer Father   . Stomach cancer Father   . Seizures Father   . Depression Sister   . Polycystic ovary syndrome Sister   . Endometriosis Sister   . Kidney cancer Maternal Uncle   . Muscular dystrophy Maternal Uncle   . Kidney cancer Maternal Grandmother   . Multiple myeloma Maternal Grandfather   . Stroke Maternal Grandfather    Social History   Social History Narrative   Single. BS degree. In sales.   Drinks caffeine.    Wears seatbelt, Smoke detector in the home.    Firearms in the home.    Exercises routinely.    Feels safe in relationships.     Allergies as of 10/07/2019      Reactions   Other Anaphylaxis   Iantha Fallen, Tomato   Dairy Aid [lactase]    Tylenol [acetaminophen]       Medication List       Accurate as of October 07, 2019  9:00 AM. If you have any questions, ask your nurse or doctor.        escitalopram 10 MG tablet Commonly known as: LEXAPRO Take 1 tablet (10 mg total) by mouth daily.   fluticasone 50 MCG/ACT nasal spray Commonly known as: FLONASE Place into the nose.   hydrOXYzine 25 MG capsule Commonly known as: Vistaril Take 1-2 capsules (25-50 mg total) by mouth at bedtime.   levocetirizine 5 MG tablet Commonly known as: Xyzal Take 1 tablet (5 mg total) by mouth every evening. Need appt for further refills   levonorgestrel 20 MCG/24HR IUD Commonly known as: MIRENA 1 each by Intrauterine route once.       All past medical history, surgical history, allergies, family history, immunizations andmedications were updated in the EMR today and reviewed under the history and medication portions of their EMR.     No results found for this or any previous visit (from the past 2160 hour(s)).  No  results found.   ROS: 14 pt review of systems performed and negative (unless mentioned in an HPI)  Objective: BP 123/87 (BP Location: Left Arm, Patient Position: Sitting, Cuff Size: Normal)   Pulse 89   Temp 98 F (36.7 C) (Temporal)   Resp 16   Ht '5\' 4"'$  (1.626 m)   Wt 162 lb (73.5 kg)   SpO2 97%   BMI 27.81 kg/m  Gen: Afebrile. No acute distress. Nontoxic in appearance, well-developed, well-nourished,  Pleasant caucasian female. Mildly overweight.  HENT: AT. [place]. Bilateral TM visualized and normal in appearance, normal external auditory canal. MMM, no oral lesions, adequate dentition. Bilateral nares within normal limits. Throat without erythema, ulcerations or exudates. no Cough on exam, no hoarseness on exam. Eyes:Pupils Equal Round Reactive to light, Extraocular movements intact,  Conjunctiva without redness, discharge or icterus. Neck/lymp/endocrine: [employer]  lymphadenopathy, no thyromegaly CV: RRR no murmur, no edema, +2/4 P posterior tibialis pulses. no carotid bruits. No JVD. Chest: CTAB, no wheeze, rhonchi or crackles. normal Respiratory effort. good Air movement. Abd: Soft. flat. NTND. BS present. no Masses palpated. No hepatosplenomegaly. No rebound tenderness or guarding. Skin: no rashes, purpura or petechiae. Warm and well-perfused. Skin intact. Neuro/Msk:  Normal gait. PERLA. EOMi. Alert. Oriented x3.  Cranial nerves II through XII intact. Muscle strength 5/5 upper/lower extremity. DTRs equal bilaterally. Psych: Normal affect, dress and demeanor. Normal speech. Normal thought content and judgment.  No exam data present  Assessment/plan: Vicki Young is a 34 y.o. female present for CPE Anxiety Improved with medication start.  TSH collected Continue Lexapro 10 mg daily and Vistaril 25-50 mg nightly. F/u 6 mos- can be virtual Overweight (BMI 25.0-29.9)/IUD (intrauterine device) in place Exercise encouraged Long term current use of hormonal contraceptive - Comp  Met (CMET) - Lipid panel Diabetes mellitus screening - Hemoglobin A1c Screening for deficiency anemia - CBC Seasonal allergic rhinitis due to pollen Stable.  Continue xyzal Encounter for preventive health examination Patient was encouraged to exercise greater than 150 minutes a week. Patient was encouraged to choose a diet filled with fresh fruits and vegetables, and lean meats. AVS provided to patient today for education/recommendation on gender specific health and safety maintenance. Colonoscopy: No fhx, screen routine at 44  Mammogram: FHx no. Screen routine at 39 Cervical cancer screening: last pap: 2018, [name], NP. IUD in place 2018. She is looking to est w/ a new gyn. No LMP recorded. (Menstrual status: IUD). Immunizations: tdap 2018, Influenza 2020(encouraged yearly). covid vaccines completed Infectious disease screening: HIV completed DEXA: N/A Return in about 1 year (around 10/06/2020) for CPE (30 min) and 6 mos for [facility].  Orders Placed This Encounter  Procedures  . CBC  . Comp Met (CMET)  . TSH  . Lipid panel  . Hemoglobin A1c    Meds ordered this encounter  Medications  . hydrOXYzine (VISTARIL) 25 MG capsule    Sig: Take 1-2 capsules (25-50 mg total) by mouth at bedtime.    Dispense:  180 capsule    Refill:  1  . escitalopram (LEXAPRO) 10 MG tablet    Sig: Take 1 tablet (10 mg total) by mouth daily.    Dispense:  90 tablet    Refill:  1  . levocetirizine (XYZAL) 5 MG tablet    Sig: Take 1 tablet (5 mg total) by mouth every evening. Need appt for further refills    Dispense:  90 tablet    Refill:  3   Referral Orders  No referral(s) requested today     Electronically signed by: Howard Pouch, [place]

## 2019-10-07 NOTE — Patient Instructions (Addendum)
I have refilled your medications.  Follow up 6 mos on your chronic conditions and 1 year for physical.     Health Maintenance, Female Adopting a healthy lifestyle and getting preventive care are important in promoting health and wellness. Ask your health care provider about:  The right schedule for you to have regular tests and exams.  Things you can do on your own to prevent diseases and keep yourself healthy. What should I know about diet, weight, and exercise? Eat a healthy diet   Eat a diet that includes plenty of vegetables, fruits, low-fat dairy products, and lean protein.  Do not eat a lot of foods that are high in solid fats, added sugars, or sodium. Maintain a healthy weight Body mass index (BMI) is used to identify weight problems. It estimates body fat based on height and weight. Your health care provider can help determine your BMI and help you achieve or maintain a healthy weight. Get regular exercise Get regular exercise. This is one of the most important things you can do for your health. Most adults should:  Exercise for at least 150 minutes each week. The exercise should increase your heart rate and make you sweat (moderate-intensity exercise).  Do strengthening exercises at least twice a week. This is in addition to the moderate-intensity exercise.  Spend less time sitting. Even light physical activity can be beneficial. Watch cholesterol and blood lipids Have your blood tested for lipids and cholesterol at 35 years of age, then have this test every 5 years. Have your cholesterol levels checked more often if:  Your lipid or cholesterol levels are high.  You are older than 35 years of age.  You are at high risk for heart disease. What should I know about cancer screening? Depending on your health history and family history, you may need to have cancer screening at various ages. This may include screening for:  Breast cancer.  Cervical cancer.  Colorectal  cancer.  Skin cancer.  Lung cancer. What should I know about heart disease, diabetes, and high blood pressure? Blood pressure and heart disease  High blood pressure causes heart disease and increases the risk of stroke. This is more likely to develop in people who have high blood pressure readings, are of African descent, or are overweight.  Have your blood pressure checked: ? Every 3-5 years if you are 80-36 years of age. ? Every year if you are 43 years old or older. Diabetes Have regular diabetes screenings. This checks your fasting blood sugar level. Have the screening done:  Once every three years after age 30 if you are at a normal weight and have a low risk for diabetes.  More often and at a younger age if you are overweight or have a high risk for diabetes. What should I know about preventing infection? Hepatitis B If you have a higher risk for hepatitis B, you should be screened for this virus. Talk with your health care provider to find out if you are at risk for hepatitis B infection. Hepatitis C Testing is recommended for:  Everyone born from [birth year] through 1966.  Anyone with known risk factors for hepatitis C. Sexually transmitted infections (STIs)  Get screened for STIs, including gonorrhea and chlamydia, if: ? You are sexually active and are younger than 34 years of age. ? You are older than 35 years of age and your health care provider tells you that you are at risk for this type of infection. ? Your sexual activity  has changed since you were last screened, and you are at increased risk for chlamydia or gonorrhea. Ask your health care provider if you are at risk.  Ask your health care provider about whether you are at high risk for HIV. Your health care provider may recommend a prescription medicine to help prevent HIV infection. If you choose to take medicine to prevent HIV, you should first get tested for HIV. You should then be tested every 3 months for as long as  you are taking the medicine. Pregnancy  If you are about to stop having your period (premenopausal) and you may become pregnant, seek counseling before you get pregnant.  Take 400 to 800 micrograms (mcg) of folic acid every day if you become pregnant.  Ask for birth control (contraception) if you want to prevent pregnancy. Osteoporosis and menopause Osteoporosis is a disease in which the bones lose minerals and strength with aging. This can result in bone fractures. If you are 13 years old or older, or if you are at risk for osteoporosis and fractures, ask your health care provider if you should:  Be screened for bone loss.  Take a calcium or vitamin D supplement to lower your risk of fractures.  Be given hormone replacement therapy (HRT) to treat symptoms of menopause. Follow these instructions at home: Lifestyle  Do not use any products that contain nicotine or tobacco, such as cigarettes, e-cigarettes, and chewing tobacco. If you need help quitting, ask your health care provider.  Do not use street drugs.  Do not share needles.  Ask your health care provider for help if you need support or information about quitting drugs. Alcohol use  Do not drink alcohol if: ? Your health care provider tells you not to drink. ? You are pregnant, may be pregnant, or are planning to become pregnant.  If you drink alcohol: ? Limit how much you use to 0-1 drink a day. ? Limit intake if you are breastfeeding.  Be aware of how much alcohol is in your drink. In the [place] one drink equals one 12 oz bottle of beer (355 mL), one 5 oz glass of wine (148 mL), or one 1 oz glass of hard liquor (44 mL). General instructions  Schedule regular health, dental, and eye exams.  Stay current with your vaccines.  Tell your health care provider if: ? You often feel depressed. ? You have ever been abused or do not feel safe at home. Summary  Adopting a healthy lifestyle and getting preventive care are  important in promoting health and wellness.  Follow your health care provider's instructions about healthy diet, exercising, and getting tested or screened for diseases.  Follow your health care provider's instructions on monitoring your cholesterol and blood pressure. This information is not intended to replace advice given to you by your health care provider. Make sure you discuss any questions you have with your health care provider. Document Revised: 07/08/2018 Document Reviewed: 07/08/2018 Elsevier Patient Education  2020 [employer].

## 2019-10-08 ENCOUNTER — Encounter: Payer: Self-pay | Admitting: Family Medicine

## 2019-10-14 ENCOUNTER — Encounter: Payer: 59 | Admitting: Family Medicine

## 2019-12-22 ENCOUNTER — Telehealth: Payer: Self-pay

## 2019-12-22 NOTE — Telephone Encounter (Signed)
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  She had CPE with Dr. Claiborne Billings on 10-07-2019.  Please schedule nurse visit with patient.

## 2019-12-23 NOTE — Telephone Encounter (Signed)
Pt was called and VM was left to return call  °

## 2019-12-23 NOTE — Telephone Encounter (Signed)
Pt returned call and stated she was in a hurry and would need it soon. She was advised Dr Claiborne Billings would be out of the office until Tuesday so we could not place the order until then. Pt said she would just run to [facility] to have completed.

## 2019-12-23 NOTE — Telephone Encounter (Signed)
Last TB, historical immunization was 04/30/2019.

## 2020-02-25 IMAGING — US RIGHT LOWER EXTREMITY SOFT TISSUE ULTRASOUND LIMITED
1 series · 13 of 25 positions shown · non-contrast
Comparison: Report for CT abdomen/pelvis 05/04/2013 (images
currently unavailable)

CLINICAL DATA: Hip pain, acute right. Marble sized soft tissue mass
right anterior hip area, 1-2 cm inferior to in inguinal ligament,
and medial to quad ligament.

EXAM:
ULTRASOUND right LOWER EXTREMITY LIMITED
TECHNIQUE: Ultrasound examination of the lower extremity soft tissues was
performed in the area of clinical concern. This was a targeted
ultrasound of the right groin soft tissues. Limited imaging of the
left groin soft tissues was also performed for the purposes of
comparison.

[Series 1: right lower extremity soft tissue ultrasound limit · 0.04mm/px · 13 of 29 slices shown]
[im 1/29]
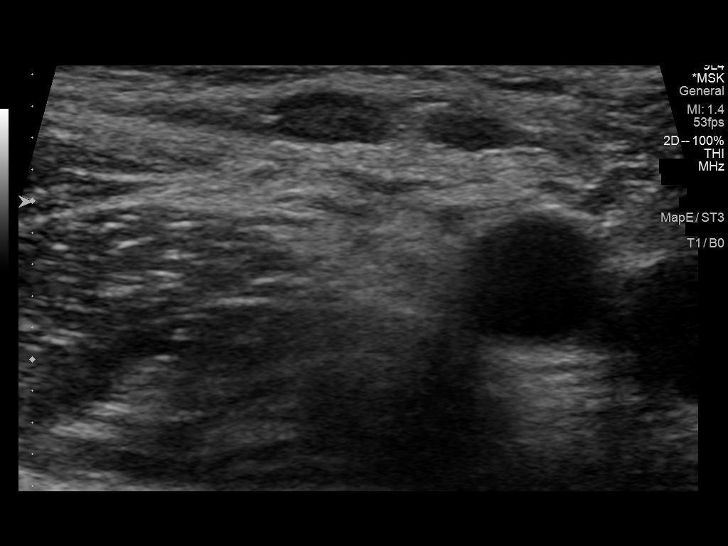
[im 3/29]
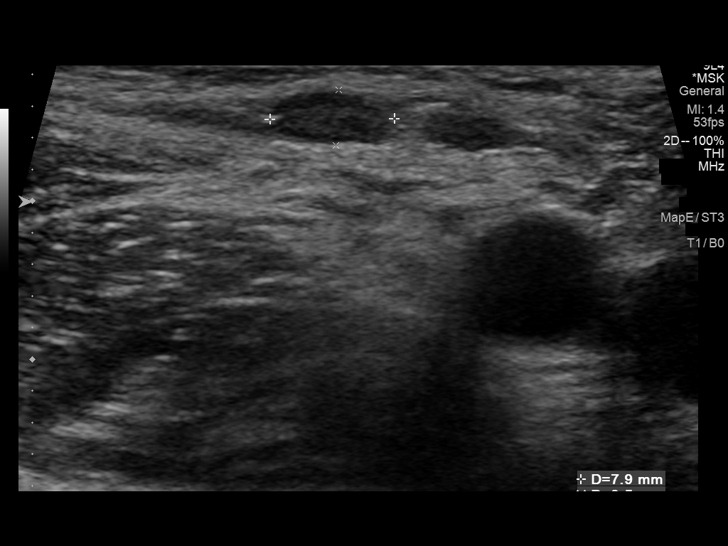
[im 5/29]
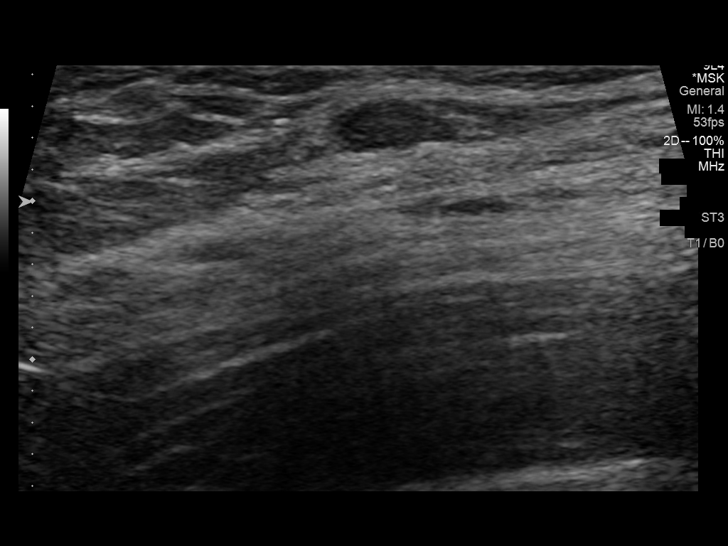
[im 8/29]
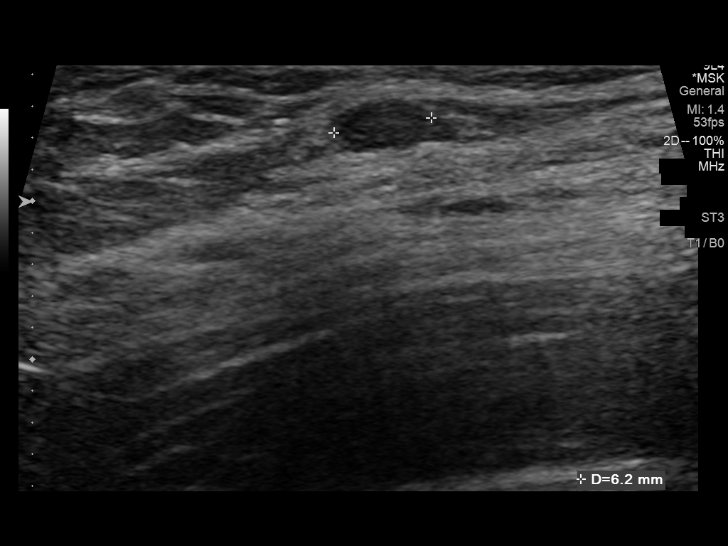
[im 10/29]
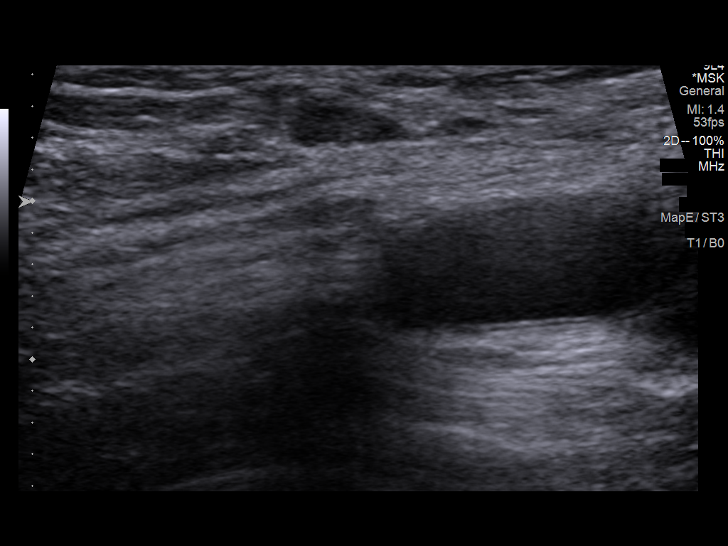
[im 12/29]
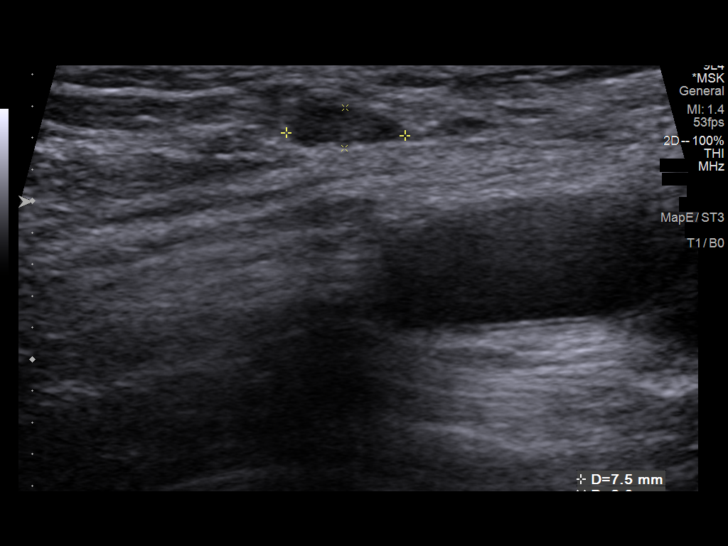
[im 15/29]
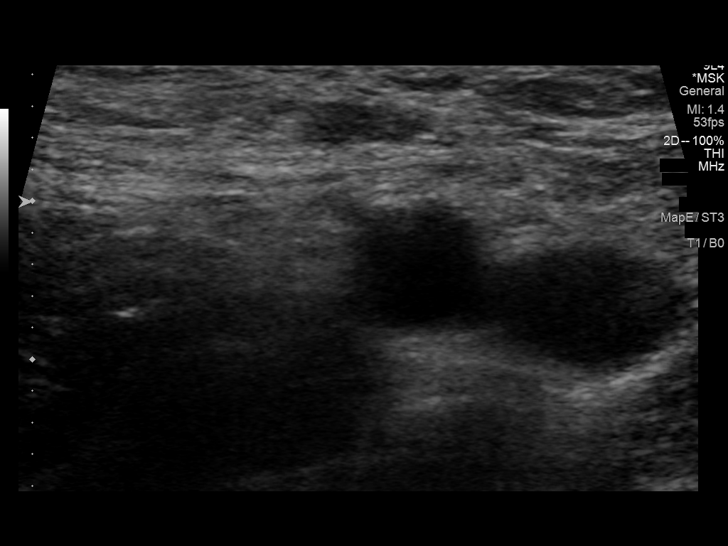
[im 17/29]
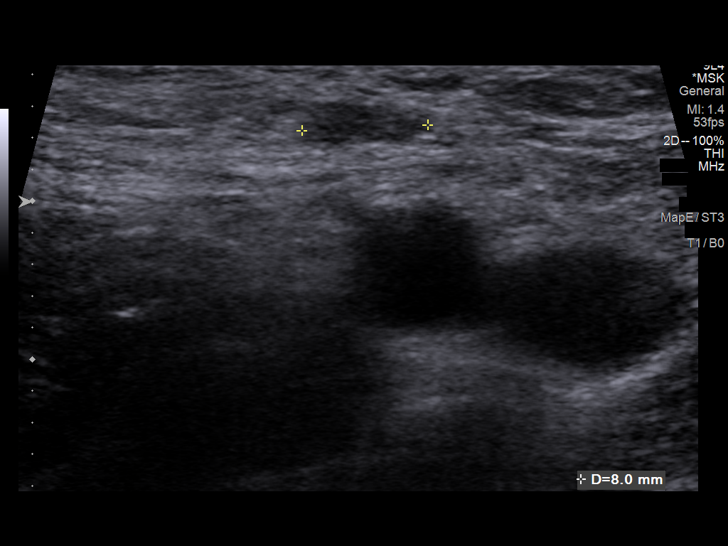
[im 19/29]
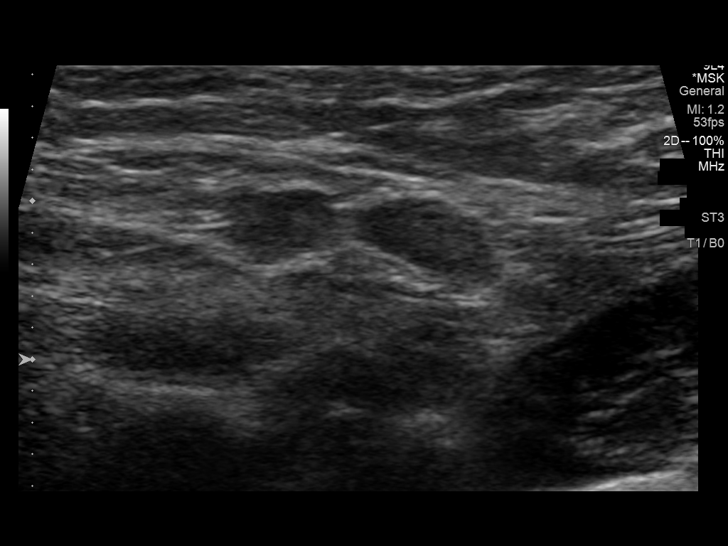
[im 22/29]
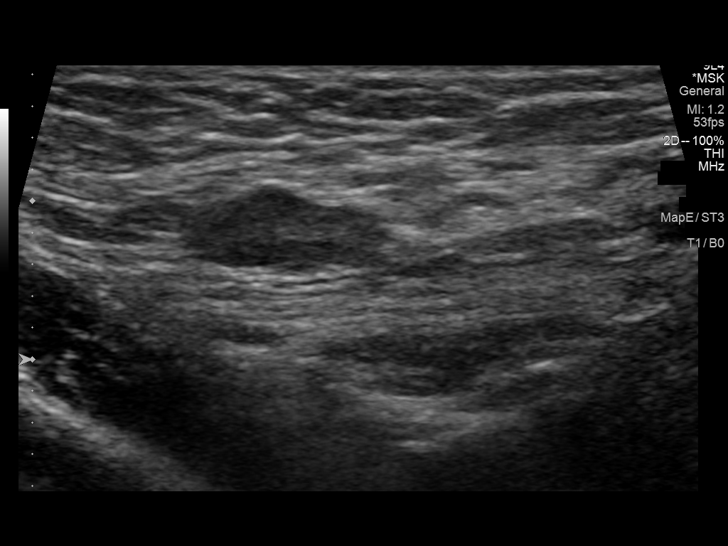
[im 24/29]
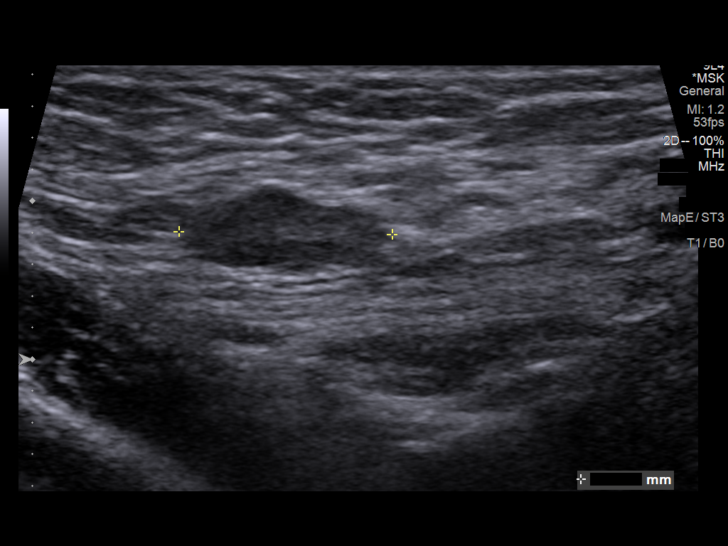
[im 26/29]
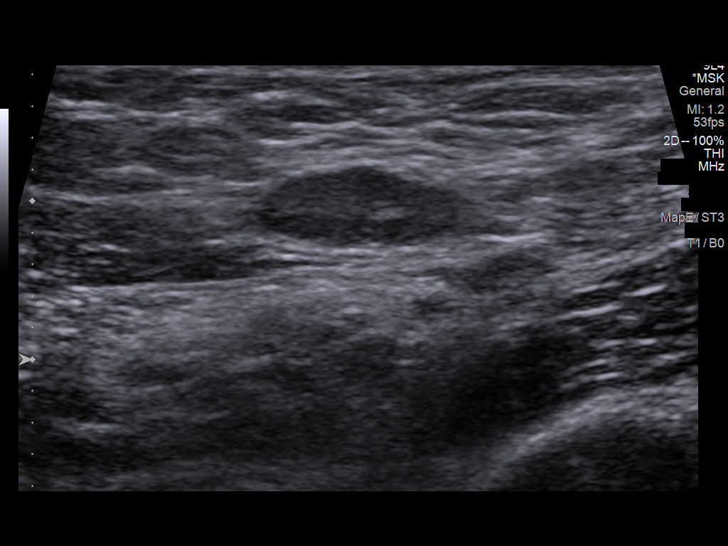
[im 29/29]
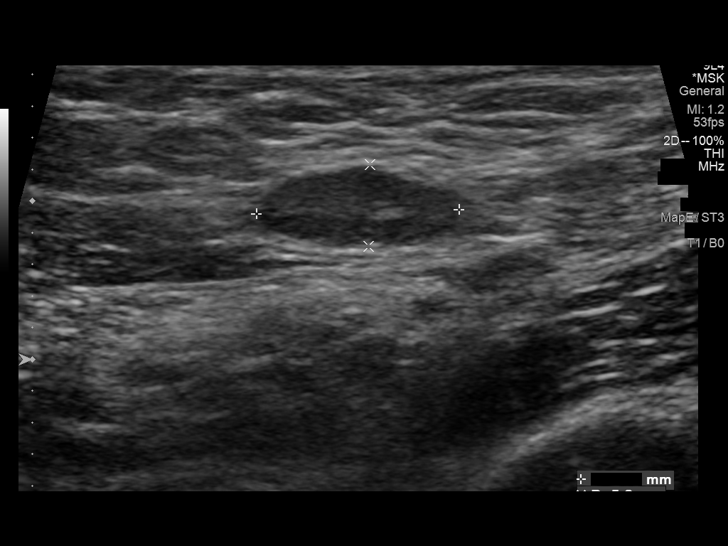

[13 of 25 positions shown; findings below may reference images not displayed]

FINDINGS: Joint Space: No targeted imaging performed.

Muscles: No targeted imaging performed.

Tendons: No targeted imaging performed.

Other Soft Tissue Structures: Targeted imaging of the right groin
with limited imaging of the left groin for the purposes of
comparison. There are bilateral ovoid soft tissue foci consistent
with lymph nodes. None of the lymph nodes are enlarged in short
axis. No discrete soft tissue mass is demonstrated.
IMPRESSION: No discrete soft tissue mass is demonstrated in the area of concern.

Nonenlarged lymph nodes within the right groin, similar in size and
appearance when compared to those in the left groin.

## 2020-03-02 ENCOUNTER — Encounter: Payer: Self-pay | Admitting: Family Medicine

## 2020-03-03 NOTE — Telephone Encounter (Signed)
Patient sent the following mychart message regarding travel immunizations: Hello!  I will be traveling to [place] on Sept 11.  It is recommended that we get vaccinated against: Hep A Hep B (I believe I have this but could you please confirm?) Typhoid  Possibly also: Rabies Yellow fever Polio (Do I really need this? Isn't polio eradicated?) Are these available through you or do you know where I can get these?  If I need to prioritize, Hep A and B and typhoid are considered the most important.   I can provide her with travel immunizations contact number and website but wanted to check with you if she was able to get any of the vaccines here first. Please advise, thanks.

## 2020-03-15 ENCOUNTER — Encounter: Payer: Self-pay | Admitting: Family Medicine

## 2020-03-27 ENCOUNTER — Telehealth (INDEPENDENT_AMBULATORY_CARE_PROVIDER_SITE_OTHER): Payer: 59 | Admitting: Family Medicine

## 2020-03-27 ENCOUNTER — Other Ambulatory Visit: Payer: Self-pay

## 2020-03-27 ENCOUNTER — Encounter: Payer: Self-pay | Admitting: Family Medicine

## 2020-03-27 VITALS — Ht 64.0 in

## 2020-03-27 DIAGNOSIS — F419 Anxiety disorder, unspecified: Secondary | ICD-10-CM

## 2020-03-27 MED ORDER — ESCITALOPRAM OXALATE 10 MG PO TABS: 10.0000 mg | ORAL_TABLET | Freq: Every day | ORAL | 5 refills | Status: AC

## 2020-03-27 MED ORDER — HYDROXYZINE PAMOATE 25 MG PO CAPS
25.0000 mg | ORAL_CAPSULE | Freq: Every day | ORAL | 5 refills | Status: AC
Start: 2020-03-27 — End: ?

## 2020-03-27 NOTE — Progress Notes (Signed)
Pre visit review using our clinic review tool, if applicable. No additional management support is needed unless otherwise documented below in the visit note. 

## 2020-03-27 NOTE — Progress Notes (Signed)
VIRTUAL VISIT VIA VIDEO  I connected with Vicki Young on 03/27/20 at  3:30 PM EDT by a video enabled telemedicine application and verified that I am speaking with the correct person using two identifiers. Location patient: Home Location provider: [facility], Office Persons participating in the virtual visit: Patient, Dr. Raoul Pitch and Raliegh Ip. Canter, CMA  I discussed the limitations of evaluation and management by telemedicine and the availability of in person appointments. The patient expressed understanding and agreed to proceed.      Patient ID: Vicki Young, female  DOB: 1985-11-26, 34 y.o.   MRN: [id number] Patient Care Team    Relationship Specialty Notifications Start End  Ma Hillock, DO PCP - General Family Medicine  10/14/16   Arlyce Harman, NP Nurse Practitioner Nurse Practitioner  07/13/18     Chief Complaint  Patient presents with  . Follow-up    Anxiety- Pt unable to get bp, pulse    Subjective:  Vicki Young is a 34 y.o.  Female  present for   Anxiety:  Pt reports she is doing great l on lexapro 10 and vistaril before bed.  Prior note: Patient reports she has been having increased anxiety since the beginning of the pandemic, but felt she was coping okay with that.  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  She reports this is overwhelming her and giving her much anxiety.  She is worried she is going to bring him the coronavirus to 1 of her family members.  Allergy:  Doing well on xyzal.  Depression screen [facility] 2/9 07/13/2018 04/10/2017 10/14/2016  Decreased Interest 0 0 0  Down, Depressed, Hopeless 0 0 0  PHQ - 2 Score 0 0 0   GAD 7 : Generalized Anxiety Score 05/11/2019  Nervous, Anxious, on Edge 2  Control/stop worrying 2  Worry too much - different things 3  Trouble relaxing 3  Restless 0  Easily annoyed or irritable 1  Afraid - awful might  happen 2  Total GAD 7 Score 13  Anxiety Difficulty Not difficult at all    Immunization History  Administered Date(s) Administered  . DTaP 01/01/1985, 03/05/1985, 04/22/1985, 05/09/1986, 12/24/1989  . Hepatitis B 04/11/2017  . Hepatitis B, adult 10/12/2017  . HiB (PRP-OMP) 01/01/1985, 03/05/1985, 04/22/1985, 05/09/1986, 12/05/1986  . IPV 01/01/1985, 03/05/1985, 04/22/1985, 05/09/1986, 12/24/1989  . Influenza Inj Mdck Quad Pf 05/27/2018, 03/23/2019  . Influenza-Unspecified 04/28/2016, 04/06/2017  . MMR 02/09/1986, 12/24/1989  . Meningococcal Conjugate 02/23/2003  . PFIZER SARS-COV-2 Vaccination 08/22/2019, 09/15/2019  . PPD Test 03/25/2016, 07/23/2017, 08/05/2018, 04/30/2019, 12/29/2019, 03/18/2020  . Tdap 07/29/2005, 04/06/2017    Past Medical History:  Diagnosis Date  . Allergy   . Anemia   . Chicken pox   . Depression   . Right hand fracture 2012  . Stress fracture 03/2013   R patella; had visc and steroid injections (dr. Wynetta Emery- [facility])   Allergies  Allergen Reactions  . Other Anaphylaxis    Dailry, Tomato  . Dairy Aid [Lactase]   . Tylenol [Acetaminophen]    Past Surgical History:  Procedure Laterality Date  . endometrial cyst  2003  . ESOPHAGOGASTRODUODENOSCOPY  2016   "removal of meat stuck in her throat" with EGD  . right hand fracture  2012  . TONSILLECTOMY AND ADENOIDECTOMY  2003  . [place] EXTRACTION  2004   Family History  Problem Relation Age of Onset  . Ovarian cancer Mother   . Depression Mother   . Alcohol abuse Father   . Lung cancer Father   . Stomach cancer Father   . Seizures Father   . Depression Sister   . Polycystic ovary syndrome Sister   . Endometriosis Sister   . Kidney cancer Maternal Uncle   . Muscular dystrophy Maternal Uncle   . Kidney cancer Maternal Grandmother   . Multiple myeloma Maternal Grandfather   . Stroke Maternal Grandfather    Social History   Social History Narrative   Single. BS degree. In sales.     Drinks caffeine.    Wears seatbelt, Smoke detector in the home.    Firearms in the home.    Exercises routinely.    Feels safe in relationships.     Allergies as of 03/27/2020      Reactions   Other Anaphylaxis   Iantha Fallen, Tomato   Dairy Aid [lactase]    Tylenol [acetaminophen]       Medication List       Accurate as of March 27, 2020  2:23 PM. If you have any questions, ask your nurse or doctor.        STOP taking these medications   fluticasone 50 MCG/ACT nasal spray Commonly known as: FLONASE Stopped by: Howard Pouch, DO     TAKE these medications   escitalopram 10 MG tablet Commonly known as: LEXAPRO Take 1 tablet (10 mg total) by mouth daily.   hydrOXYzine 25 MG capsule Commonly known as: Vistaril Take 1-2 capsules (25-50 mg total) by mouth at bedtime.   levocetirizine 5 MG tablet Commonly known as: Xyzal Take 1 tablet (5 mg total) by mouth every evening. Need appt for further refills   levonorgestrel 20 MCG/24HR IUD Commonly known as: MIRENA 1 each by Intrauterine route once.       All past medical history, surgical history, allergies, family history, immunizations andmedications were updated in the EMR today and reviewed under the history and medication portions of their EMR.     No results found for this or any previous visit (from the past 2160 hour(s)).  No results found.   ROS: 14 pt review of systems performed and negative (unless mentioned in an HPI)  Objective: Ht $RemoveBefor'5\' 4"'GegYOVjaFcoY$  (1.626 m)   BMI 27.81 kg/m  Gen: Afebrile. No acute distress.  HENT: AT. [place].  Eyes:Pupils Equal Round Reactive to light, Extraocular movements intact,  Conjunctiva without redness, discharge or icterus. Neuro:  Alert. Oriented 3  Psych: Normal affect, dress and demeanor. Normal speech. Normal thought content and judgment.   No exam data present  Assessment/plan: [name] is a 34 y.o. female present for CPE Anxiety stable.  Continue Lexapro 10 mg  daily Continue  Vistaril 25-50 mg nightly. F/u 5.5 mos- or cpe  Seasonal allergic rhinitis due to pollen Stable.  Continue xyzal  No follow-ups on file.  No orders of the defined types were placed in this encounter.   Meds ordered this encounter  Medications  . escitalopram (LEXAPRO) 10 MG tablet    Sig: Take 1 tablet (10 mg total) by mouth daily.    Dispense:  30 tablet    Refill:  5  . hydrOXYzine (VISTARIL) 25 MG capsule    Sig: Take 1-2 capsules (25-50 mg total) by mouth at bedtime.    Dispense:  60 capsule    Refill:  5   Referral Orders  No referral(s) requested  today     Electronically signed by: Howard Pouch, DO [place]
